# Patient Record
Sex: Female | Born: 1970 | Race: White | Hispanic: No | Marital: Single | Smoking: Current every day smoker
Health system: Southern US, Community
[De-identification: ages and names within clinical notes are randomized; demographics above are authoritative.]

## PROBLEM LIST (undated history)

## (undated) DIAGNOSIS — F419 Anxiety disorder, unspecified: Secondary | ICD-10-CM

## (undated) DIAGNOSIS — R51 Headache: Secondary | ICD-10-CM

## (undated) DIAGNOSIS — K219 Gastro-esophageal reflux disease without esophagitis: Secondary | ICD-10-CM

## (undated) DIAGNOSIS — F17200 Nicotine dependence, unspecified, uncomplicated: Secondary | ICD-10-CM

## (undated) DIAGNOSIS — E785 Hyperlipidemia, unspecified: Secondary | ICD-10-CM

## (undated) HISTORY — DX: Anxiety disorder, unspecified: F41.9

## (undated) HISTORY — PX: WISDOM TOOTH EXTRACTION: SHX21

## (undated) HISTORY — DX: Nicotine dependence, unspecified, uncomplicated: F17.200

---

## 2011-02-17 ENCOUNTER — Other Ambulatory Visit: Payer: Self-pay | Admitting: Neurosurgery

## 2011-02-24 ENCOUNTER — Encounter (HOSPITAL_COMMUNITY): Payer: Self-pay | Admitting: Pharmacy Technician

## 2011-03-04 ENCOUNTER — Encounter (HOSPITAL_COMMUNITY)
Admission: RE | Admit: 2011-03-04 | Discharge: 2011-03-04 | Disposition: A | Discharge status: Home / Self Care | Payer: BC Managed Care – PPO | Source: Ambulatory Visit | Attending: Neurosurgery | Admitting: Neurosurgery

## 2011-03-04 ENCOUNTER — Encounter (HOSPITAL_COMMUNITY): Payer: Self-pay

## 2011-03-04 HISTORY — DX: Gastro-esophageal reflux disease without esophagitis: K21.9

## 2011-03-04 HISTORY — DX: Headache: R51

## 2011-03-04 LAB — CBC
HCT: 42.9 % (ref 36.0–46.0)
MCH: 30.6 pg (ref 26.0–34.0)
MCV: 89.4 fL (ref 78.0–100.0)
Platelets: 240 10*3/uL (ref 150–400)
RBC: 4.8 MIL/uL (ref 3.87–5.11)
WBC: 8.2 10*3/uL (ref 4.0–10.5)

## 2011-03-04 LAB — SURGICAL PCR SCREEN: Staphylococcus aureus: NEGATIVE

## 2011-03-04 NOTE — Pre-Procedure Instructions (Addendum)
18 Lorraine Gray  03/04/2011   Your procedure is scheduled on:  2.13.13  Report to [facility] at 630AM.  Call this number if you have problems the morning of surgery: [phone]   Remember:   Do not eat food:After Midnight.  May have clear liquids: up to 4 Hours before arrival.  Clear liquids include soda, tea, black coffee, apple or grape juice, broth.  Take these medicines the morning of surgery with A SIP OF WATER:xanax  STOP goody powder, any asa , nsaids, herbal meds,blood thinners   Do not wear jewelry, make-up or nail polish.  Do not wear lotions, powders, or perfumes. You may wear deodorant.  Do not shave 48 hours prior to surgery.  Do not bring valuables to the hospital.  Contacts, dentures or bridgework may not be worn into surgery.  Leave suitcase in the car. After surgery it may be brought to your room.  For patients admitted to the hospital, checkout time is 11:00 AM the day of discharge.   Patients discharged the day of surgery will not be allowed to drive home.  Name and phone number of your driver:mom debbie elmore [phone]  Special Instructions: CHG Shower Use Special Wash: 1/2 bottle night before surgery and 1/2 bottle morning of surgery.   Please read over the following fact sheets that you were given: Pain Booklet, Coughing and Deep Breathing, MRSA Information and Surgical Site Infection Prevention

## 2011-03-10 MED ORDER — CEFAZOLIN SODIUM 1-5 GM-% IV SOLN
1.0000 g | INTRAVENOUS | Status: AC
  Administered 2011-03-11: 1 g via INTRAVENOUS
  Filled 2011-03-10: qty 50

## 2011-03-11 ENCOUNTER — Ambulatory Visit (HOSPITAL_COMMUNITY)
Admission: RE | Admit: 2011-03-11 | Discharge: 2011-03-12 | Disposition: A | Discharge status: Home / Self Care | Payer: BC Managed Care – PPO | Source: Ambulatory Visit | Attending: Neurosurgery | Admitting: Neurosurgery

## 2011-03-11 ENCOUNTER — Encounter (HOSPITAL_COMMUNITY)
Admission: RE | Disposition: A | Discharge status: Home / Self Care | Payer: Self-pay | Source: Ambulatory Visit | Attending: Neurosurgery

## 2011-03-11 ENCOUNTER — Ambulatory Visit (HOSPITAL_COMMUNITY): Payer: BC Managed Care – PPO

## 2011-03-11 ENCOUNTER — Encounter (HOSPITAL_COMMUNITY): Payer: Self-pay | Admitting: Anesthesiology

## 2011-03-11 ENCOUNTER — Ambulatory Visit (HOSPITAL_COMMUNITY): Payer: BC Managed Care – PPO | Admitting: Anesthesiology

## 2011-03-11 ENCOUNTER — Encounter (HOSPITAL_COMMUNITY): Payer: Self-pay | Admitting: *Deleted

## 2011-03-11 DIAGNOSIS — K219 Gastro-esophageal reflux disease without esophagitis: Secondary | ICD-10-CM | POA: Insufficient documentation

## 2011-03-11 DIAGNOSIS — M5000 Cervical disc disorder with myelopathy, unspecified cervical region: Secondary | ICD-10-CM | POA: Insufficient documentation

## 2011-03-11 DIAGNOSIS — Z981 Arthrodesis status: Secondary | ICD-10-CM

## 2011-03-11 DIAGNOSIS — M4712 Other spondylosis with myelopathy, cervical region: Secondary | ICD-10-CM | POA: Insufficient documentation

## 2011-03-11 DIAGNOSIS — Z01812 Encounter for preprocedural laboratory examination: Secondary | ICD-10-CM | POA: Insufficient documentation

## 2011-03-11 DIAGNOSIS — R51 Headache: Secondary | ICD-10-CM | POA: Insufficient documentation

## 2011-03-11 HISTORY — PX: ANTERIOR CERVICAL DECOMP/DISCECTOMY FUSION: SHX1161

## 2011-03-11 SURGERY — ANTERIOR CERVICAL DECOMPRESSION/DISCECTOMY FUSION 3 LEVELS
Anesthesia: General | Site: Neck | Wound class: Clean

## 2011-03-11 MED ORDER — ROCURONIUM BROMIDE 100 MG/10ML IV SOLN
INTRAVENOUS | Status: DC | PRN
  Administered 2011-03-11: 50 mg via INTRAVENOUS

## 2011-03-11 MED ORDER — THROMBIN 20000 UNITS EX KIT
PACK | CUTANEOUS | Status: DC | PRN
  Administered 2011-03-11: 09:00:00 via TOPICAL

## 2011-03-11 MED ORDER — ONDANSETRON HCL 4 MG/2ML IJ SOLN
INTRAMUSCULAR | Status: DC | PRN
  Administered 2011-03-11: 4 mg via INTRAVENOUS

## 2011-03-11 MED ORDER — BACITRACIN 50000 UNITS IM SOLR
INTRAMUSCULAR | Status: AC
  Filled 2011-03-11: qty 1

## 2011-03-11 MED ORDER — FENTANYL CITRATE 0.05 MG/ML IJ SOLN
INTRAMUSCULAR | Status: AC
  Filled 2011-03-11: qty 2

## 2011-03-11 MED ORDER — ZOLPIDEM TARTRATE 10 MG PO TABS: 10.0000 mg | ORAL_TABLET | Freq: Every evening | ORAL | Status: DC | PRN

## 2011-03-11 MED ORDER — VECURONIUM BROMIDE 10 MG IV SOLR
INTRAVENOUS | Status: DC | PRN
  Administered 2011-03-11: 1 mg via INTRAVENOUS
  Administered 2011-03-11: 2 mg via INTRAVENOUS

## 2011-03-11 MED ORDER — PROMETHAZINE HCL 25 MG/ML IJ SOLN
6.2500 mg | INTRAMUSCULAR | Status: DC | PRN
  Administered 2011-03-11: 6.25 mg via INTRAVENOUS

## 2011-03-11 MED ORDER — ACETAMINOPHEN 325 MG PO TABS: 650.0000 mg | ORAL_TABLET | ORAL | Status: DC | PRN

## 2011-03-11 MED ORDER — LACTATED RINGERS IV SOLN: INTRAVENOUS | Status: DC

## 2011-03-11 MED ORDER — DEXAMETHASONE SODIUM PHOSPHATE 4 MG/ML IJ SOLN
4.0000 mg | Freq: Four times a day (QID) | INTRAMUSCULAR | Status: AC
  Administered 2011-03-11 (×2): 4 mg via INTRAVENOUS
  Filled 2011-03-11 (×2): qty 1

## 2011-03-11 MED ORDER — BUPIVACAINE-EPINEPHRINE 0.5% -1:200000 IJ SOLN
INTRAMUSCULAR | Status: DC | PRN
  Administered 2011-03-11: 10 mL

## 2011-03-11 MED ORDER — DEXAMETHASONE 4 MG PO TABS: 4.0000 mg | ORAL_TABLET | Freq: Four times a day (QID) | ORAL | Status: AC

## 2011-03-11 MED ORDER — ONDANSETRON HCL 4 MG/2ML IJ SOLN
4.0000 mg | INTRAMUSCULAR | Status: DC | PRN
  Administered 2011-03-11: 4 mg via INTRAVENOUS
  Filled 2011-03-11: qty 2

## 2011-03-11 MED ORDER — SODIUM CHLORIDE 0.9 % IV SOLN
INTRAVENOUS | Status: AC
  Filled 2011-03-11: qty 500

## 2011-03-11 MED ORDER — NEOSTIGMINE METHYLSULFATE 1 MG/ML IJ SOLN
INTRAMUSCULAR | Status: DC | PRN
  Administered 2011-03-11: 4 mg via INTRAVENOUS

## 2011-03-11 MED ORDER — 0.9 % SODIUM CHLORIDE (POUR BTL) OPTIME
TOPICAL | Status: DC | PRN
  Administered 2011-03-11: 1000 mL

## 2011-03-11 MED ORDER — PHENOL 1.4 % MT LIQD
1.0000 | OROMUCOSAL | Status: DC | PRN
  Administered 2011-03-11: 1 via OROMUCOSAL
  Filled 2011-03-11: qty 177

## 2011-03-11 MED ORDER — PANTOPRAZOLE SODIUM 40 MG IV SOLR
40.0000 mg | Freq: Every day | INTRAVENOUS | Status: DC
  Administered 2011-03-11: 40 mg via INTRAVENOUS
  Filled 2011-03-11 (×2): qty 40

## 2011-03-11 MED ORDER — MEPERIDINE HCL 25 MG/ML IJ SOLN: 6.2500 mg | INTRAMUSCULAR | Status: DC | PRN

## 2011-03-11 MED ORDER — PROMETHAZINE HCL 25 MG/ML IJ SOLN
INTRAMUSCULAR | Status: AC
  Filled 2011-03-11: qty 1

## 2011-03-11 MED ORDER — SODIUM CHLORIDE 0.9 % IR SOLN
Status: DC | PRN
  Administered 2011-03-11: 09:00:00

## 2011-03-11 MED ORDER — MIDAZOLAM HCL 5 MG/5ML IJ SOLN
INTRAMUSCULAR | Status: DC | PRN
  Administered 2011-03-11: 2 mg via INTRAVENOUS

## 2011-03-11 MED ORDER — PROCHLORPERAZINE 25 MG RE SUPP
25.0000 mg | Freq: Two times a day (BID) | RECTAL | Status: DC | PRN
  Administered 2011-03-11: 25 mg via RECTAL
  Filled 2011-03-11: qty 1

## 2011-03-11 MED ORDER — ACETAMINOPHEN 650 MG RE SUPP: 650.0000 mg | RECTAL | Status: DC | PRN

## 2011-03-11 MED ORDER — LACTATED RINGERS IV SOLN
INTRAVENOUS | Status: DC | PRN
  Administered 2011-03-11 (×2): via INTRAVENOUS

## 2011-03-11 MED ORDER — HYDROMORPHONE HCL 4 MG PO TABS
4.0000 mg | ORAL_TABLET | ORAL | Status: DC | PRN
  Administered 2011-03-11 – 2011-03-12 (×3): 4 mg via ORAL
  Filled 2011-03-11: qty 2
  Filled 2011-03-11: qty 4
  Filled 2011-03-11: qty 2

## 2011-03-11 MED ORDER — PROPOFOL 10 MG/ML IV EMUL
INTRAVENOUS | Status: DC | PRN
  Administered 2011-03-11: 150 mg via INTRAVENOUS

## 2011-03-11 MED ORDER — MORPHINE SULFATE 4 MG/ML IJ SOLN
1.0000 mg | INTRAMUSCULAR | Status: DC | PRN
  Administered 2011-03-11: 4 mg via INTRAVENOUS
  Filled 2011-03-11: qty 1

## 2011-03-11 MED ORDER — DIAZEPAM 5 MG PO TABS
5.0000 mg | ORAL_TABLET | Freq: Four times a day (QID) | ORAL | Status: DC | PRN
  Administered 2011-03-11: 5 mg via ORAL
  Filled 2011-03-11: qty 1

## 2011-03-11 MED ORDER — GLYCOPYRROLATE 0.2 MG/ML IJ SOLN
INTRAMUSCULAR | Status: DC | PRN
  Administered 2011-03-11: .7 mg via INTRAVENOUS

## 2011-03-11 MED ORDER — BACITRACIN ZINC 500 UNIT/GM EX OINT
TOPICAL_OINTMENT | CUTANEOUS | Status: DC | PRN
  Administered 2011-03-11: 1 via TOPICAL

## 2011-03-11 MED ORDER — FENTANYL CITRATE 0.05 MG/ML IJ SOLN
25.0000 ug | INTRAMUSCULAR | Status: DC | PRN
  Administered 2011-03-11: 50 ug via INTRAVENOUS
  Administered 2011-03-11 (×2): 25 ug via INTRAVENOUS

## 2011-03-11 MED ORDER — CEFAZOLIN SODIUM 1-5 GM-% IV SOLN
1.0000 g | Freq: Three times a day (TID) | INTRAVENOUS | Status: AC
  Administered 2011-03-11 (×2): 1 g via INTRAVENOUS
  Filled 2011-03-11 (×2): qty 50

## 2011-03-11 MED ORDER — HETASTARCH-ELECTROLYTES 6 % IV SOLN
INTRAVENOUS | Status: DC | PRN
  Administered 2011-03-11: 09:00:00 via INTRAVENOUS

## 2011-03-11 MED ORDER — FENTANYL CITRATE 0.05 MG/ML IJ SOLN
INTRAMUSCULAR | Status: DC | PRN
  Administered 2011-03-11: 150 ug via INTRAVENOUS
  Administered 2011-03-11: 50 ug via INTRAVENOUS

## 2011-03-11 MED ORDER — MENTHOL 3 MG MT LOZG
1.0000 | LOZENGE | OROMUCOSAL | Status: DC | PRN
  Administered 2011-03-11: 3 mg via ORAL
  Filled 2011-03-11: qty 9

## 2011-03-11 SURGICAL SUPPLY — 65 items
APL SKNCLS STERI-STRIP NONHPOA (GAUZE/BANDAGES/DRESSINGS) ×2
BAG DECANTER FOR FLEXI CONT (MISCELLANEOUS) ×3 IMPLANT
BENZOIN TINCTURE PRP APPL 2/3 (GAUZE/BANDAGES/DRESSINGS) ×3 IMPLANT
BIT DRILL SPINE QC 12 (BIT) ×3 IMPLANT
BLADE SURG 15 STRL LF DISP TIS (BLADE) ×2 IMPLANT
BLADE SURG 15 STRL SS (BLADE) ×3
BLADE ULTRA TIP 2M (BLADE) ×3 IMPLANT
BRUSH SCRUB EZ PLAIN DRY (MISCELLANEOUS) ×3 IMPLANT
BUR BARREL STRAIGHT FLUTE 4.0 (BURR) ×3 IMPLANT
BUR MATCHSTICK NEURO 3.0 LAGG (BURR) ×3 IMPLANT
CANISTER SUCTION 2500CC (MISCELLANEOUS) ×3 IMPLANT
CLOSURE STERI STRIP 1/2 X4 (GAUZE/BANDAGES/DRESSINGS) ×3 IMPLANT
CLOTH BEACON ORANGE TIMEOUT ST (SAFETY) ×3 IMPLANT
CONT SPEC 4OZ CLIKSEAL STRL BL (MISCELLANEOUS) ×3 IMPLANT
COVER MAYO STAND STRL (DRAPES) ×3 IMPLANT
DEVICE FUSION VIST S 14X14X6MM (Trauma) ×4 IMPLANT
DRAPE LAPAROTOMY 100X72 PEDS (DRAPES) ×3 IMPLANT
DRAPE MICROSCOPE LEICA (MISCELLANEOUS) IMPLANT
DRAPE POUCH INSTRU U-SHP 10X18 (DRAPES) ×3 IMPLANT
DRAPE SURG 17X23 STRL (DRAPES) ×6 IMPLANT
ELECT REM PT RETURN 9FT ADLT (ELECTROSURGICAL) ×3
ELECTRODE REM PT RTRN 9FT ADLT (ELECTROSURGICAL) ×2 IMPLANT
GAUZE SPONGE 4X4 16PLY XRAY LF (GAUZE/BANDAGES/DRESSINGS) ×3 IMPLANT
GLOVE BIO SURGEON STRL SZ8.5 (GLOVE) ×3 IMPLANT
GLOVE BIOGEL PI IND STRL 8 (GLOVE) ×4 IMPLANT
GLOVE BIOGEL PI INDICATOR 8 (GLOVE) ×2
GLOVE ECLIPSE 7.5 STRL STRAW (GLOVE) ×12 IMPLANT
GLOVE EXAM NITRILE LRG STRL (GLOVE) IMPLANT
GLOVE EXAM NITRILE MD LF STRL (GLOVE) ×6 IMPLANT
GLOVE EXAM NITRILE XL STR (GLOVE) IMPLANT
GLOVE EXAM NITRILE XS STR PU (GLOVE) IMPLANT
GLOVE SS BIOGEL STRL SZ 8 (GLOVE) ×2 IMPLANT
GLOVE SUPERSENSE BIOGEL SZ 8 (GLOVE) ×1
GOWN BRE IMP SLV AUR LG STRL (GOWN DISPOSABLE) IMPLANT
GOWN BRE IMP SLV AUR XL STRL (GOWN DISPOSABLE) ×6 IMPLANT
GOWN PREVENTION PLUS XXLARGE (GOWN DISPOSABLE) ×3 IMPLANT
KIT BASIN OR (CUSTOM PROCEDURE TRAY) ×3 IMPLANT
KIT ROOM TURNOVER OR (KITS) ×3 IMPLANT
MARKER SKIN DUAL TIP RULER LAB (MISCELLANEOUS) ×3 IMPLANT
NEEDLE HYPO 22GX1.5 SAFETY (NEEDLE) ×3 IMPLANT
NEEDLE SPNL 18GX3.5 QUINCKE PK (NEEDLE) ×3 IMPLANT
NS IRRIG 1000ML POUR BTL (IV SOLUTION) ×3 IMPLANT
PACK LAMINECTOMY NEURO (CUSTOM PROCEDURE TRAY) ×3 IMPLANT
PATTIES SURGICAL .5 X.5 (GAUZE/BANDAGES/DRESSINGS) ×6 IMPLANT
PATTIES SURGICAL 1X1 (DISPOSABLE) ×6 IMPLANT
PIN DISTRACTION 14MM (PIN) ×6 IMPLANT
PLATE ANT CERV XTEND 3 LV 45 (Plate) ×3 IMPLANT
PUTTY 5ML ACTIFUSE ABX (Putty) ×3 IMPLANT
RUBBERBAND STERILE (MISCELLANEOUS) IMPLANT
SCREW XTD VAR 4.2 SELF TAP 12 (Screw) ×24 IMPLANT
SPONGE GAUZE 4X4 12PLY (GAUZE/BANDAGES/DRESSINGS) ×3 IMPLANT
SPONGE INTESTINAL PEANUT (DISPOSABLE) ×6 IMPLANT
SPONGE SURGIFOAM ABS GEL 100 (HEMOSTASIS) ×3 IMPLANT
STRIP CLOSURE SKIN 1/2X4 (GAUZE/BANDAGES/DRESSINGS) ×3 IMPLANT
SUT VIC AB 0 CT1 27 (SUTURE) ×2
SUT VIC AB 0 CT1 27XBRD ANTBC (SUTURE) ×2 IMPLANT
SUT VIC AB 3-0 SH 8-18 (SUTURE) ×6 IMPLANT
SYR 20ML ECCENTRIC (SYRINGE) ×3 IMPLANT
TAPE HYPAFIX 6X30 (GAUZE/BANDAGES/DRESSINGS) ×3 IMPLANT
TOWEL OR 17X24 6PK STRL BLUE (TOWEL DISPOSABLE) ×3 IMPLANT
TOWEL OR 17X26 10 PK STRL BLUE (TOWEL DISPOSABLE) ×3 IMPLANT
TRAY FOLEY CATH 14FRSI W/METER (CATHETERS) ×3 IMPLANT
VISTA S 14X14X7 (Spacer) ×3 IMPLANT
VISTA S O 14X14X6MM (Trauma) ×6 IMPLANT
WATER STERILE IRR 1000ML POUR (IV SOLUTION) ×3 IMPLANT

## 2011-03-11 NOTE — Anesthesia Procedure Notes (Signed)
Procedure Name: Intubation Date/Time: 03/11/2011 8:40 AM Performed by: Margaree Mackintosh Pre-anesthesia Checklist: Patient identified, Timeout performed, Emergency Drugs available, Suction available and Patient being monitored Patient Re-evaluated:Patient Re-evaluated prior to inductionOxygen Delivery Method: Circle System Utilized Preoxygenation: Pre-oxygenation with 100% oxygen Intubation Type: IV induction Ventilation: Mask ventilation without difficulty Laryngoscope Size: Mac and 3 Grade View: Grade I Tube type: Oral Tube size: 7.5 mm Number of attempts: 1 Airway Equipment and Method: stylet Placement Confirmation: ETT inserted through vocal cords under direct vision,  positive ETCO2 and breath sounds checked- equal and bilateral Secured at: 21 cm Tube secured with: Tape Dental Injury: Teeth and Oropharynx as per pre-operative assessment

## 2011-03-11 NOTE — H&P (Signed)
Subjective: Patient is a 41 year old white female complains of neck pain and bilateral hand numbness. He was worked up with a cervical MRI which demonstrates spondylosis stenosis etc. at C4-5, C5-6, C6-7. I discussed the various treatment options with her including surgery. Patient has weighed weighed the risks benefits and alternatives surgery decided proceed with this operation.  Past Medical History  Diagnosis Date  . GERD (gastroesophageal reflux disease)   . Headache     History reviewed. No pertinent past surgical history.  Allergies  Allergen Reactions  . Hydrocodone Nausea And Vomiting    History  Substance Use Topics  . Smoking status: Current Everyday Smoker -- 1.0 packs/day  . Smokeless tobacco: Not on file  . Alcohol Use: No    History reviewed. No pertinent family history. Prior to Admission medications   Medication Sig Start Date End Date Taking? Authorizing Provider  ALPRAZolam Prudy Feeler) 0.5 MG tablet Take 0.5 mg by mouth daily as needed. For anxiety attacks   Yes Historical Provider, MD  calcium carbonate (TUMS - DOSED IN MG ELEMENTAL CALCIUM) 500 MG chewable tablet Chew 2 tablets by mouth 3 (three) times daily as needed. For stomach distress   Yes Historical Provider, MD  OVER THE COUNTER MEDICATION Take 1 packet by mouth every 6 (six) hours as needed. For headaches.Marland KitchenMarland KitchenGoody's Powders   Yes Historical Provider, MD     Review of Systems  Positive ROS: As above  All other systems have been reviewed and were otherwise negative with the exception of those mentioned in the HPI and as above.  Objective: Vital signs in last 24 hours: Temp:  [98.2 F (36.8 C)] 98.2 F (36.8 C) (02/13 0709) Pulse Rate:  [79] 79  (02/13 0709) Resp:  [18] 18  (02/13 0709) BP: (100)/(66) 100/66 mmHg (02/13 0709) SpO2:  [98 %] 98 % (02/13 0709)  General Appearance: Alert, cooperative, no distress, appears stated age Head: Normocephalic, without obvious abnormality, atraumatic Eyes:  PERRL, conjunctiva/corneas clear, EOM's intact, fundi benign, both eyes      Ears: Normal TM's and external ear canals, both ears Throat: Lips, mucosa, and tongue normal; teeth and gums normal Neck: Supple, symmetrical, trachea midline, no adenopathy; thyroid: No enlargement/tenderness/nodules; no carotid bruit or JVD Back: Symmetric, no curvature, ROM normal, no CVA tenderness Lungs: Clear to auscultation bilaterally, respirations unlabored Heart: Regular rate and rhythm, S1 and S2 normal, no murmur, rub or gallop Abdomen: Soft, non-tender, bowel sounds active all four quadrants, no masses, no organomegaly Extremities: Extremities normal, atraumatic, no cyanosis or edema Pulses: 2+ and symmetric all extremities Skin: Skin color, texture, turgor normal, no rashes or lesions  NEUROLOGIC:   Mental status: alert and oriented, no aphasia, good attention span, Fund of knowledge/ memory ok Motor Exam - grossly normal Sensory Exam - grossly normal Reflexes:  Coordination - grossly normal Gait - grossly normal Balance - grossly normal Cranial Nerves: I: smell Not tested  II: visual acuity  OS: None    OD: None   II: visual fields Full to confrontation  II: pupils Equal, round, reactive to light  III,VII: ptosis None  III,IV,VI: extraocular muscles  Full ROM  V: mastication Normal  V: facial light touch sensation  Normal  V,VII: corneal reflex  Present  VII: facial muscle function - upper  Normal  VII: facial muscle function - lower Normal  VIII: hearing Not tested  IX: soft palate elevation  Normal  IX,X: gag reflex Present  XI: trapezius strength  5/5  XI: sternocleidomastoid strength 5/5  XI: neck flexion strength  5/5  XII: tongue strength  Normal    Data Review Lab Results  Component Value Date   WBC 8.2 03/04/2011   HGB 14.7 03/04/2011   HCT 42.9 03/04/2011   MCV 89.4 03/04/2011   PLT 240 03/04/2011   No results found for this basename: NA, K, CL, CO2, BUN, creatinine, glucose     No results found for this basename: INR, PROTIME    Assessment/Plan: C4-5, C5-6 and C6-7 herniated disc, spondylosis, stenosis, cervical radicular/myelopathy I discussed the situation with the patient. I reviewed her MR scan with her and pointed out the abnormalities. Described the surgery, C4-5, C5-6 and C7 intracervical dissection he fusion and plating. I've shown her surgical models. We have discussed the risks, benefits, alternatives and likelihood of achieving our goals with surgery. She has decided proceed with the operation.   Nancee Brownrigg D 03/11/2011 8:14 AM

## 2011-03-11 NOTE — Anesthesia Preprocedure Evaluation (Addendum)
Anesthesia Evaluation  Patient identified by MRN, date of birth, ID band Patient awake    Reviewed: Allergy & Precautions, H&P , NPO status , Patient's Chart, lab work & pertinent test results, reviewed documented beta blocker date and time   Airway Mallampati: I      Dental  (+) Teeth Intact and Dental Advisory Given   Pulmonary  clear to auscultation        Cardiovascular neg cardio ROS Regular Normal    Neuro/Psych  Headaches,    GI/Hepatic Neg liver ROS, GERD-  Controlled,  Endo/Other  Negative Endocrine ROS  Renal/GU negative Renal ROS     Musculoskeletal   Abdominal   Peds  Hematology negative hematology ROS (+)   Anesthesia Other Findings   Reproductive/Obstetrics                          Anesthesia Physical Anesthesia Plan  ASA: II  Anesthesia Plan: General   Post-op Pain Management:    Induction: Intravenous  Airway Management Planned: Oral ETT  Additional Equipment:   Intra-op Plan:   Post-operative Plan: Extubation in OR  Informed Consent: I have reviewed the patients History and Physical, chart, labs and discussed the procedure including the risks, benefits and alternatives for the proposed anesthesia with the patient or authorized representative who has indicated his/her understanding and acceptance.   Dental advisory given  Plan Discussed with: CRNA and Anesthesiologist  Anesthesia Plan Comments:        Anesthesia Quick Evaluation

## 2011-03-11 NOTE — Transfer of Care (Signed)
Immediate Anesthesia Transfer of Care Note  Patient: Lorraine Gray  Procedure(s) Performed: Procedure(s) (LRB): ANTERIOR CERVICAL DECOMPRESSION/DISCECTOMY FUSION 3 LEVELS (N/A)  Patient Location: PACU  Anesthesia Type: General  Level of Consciousness: awake  Airway & Oxygen Therapy: Patient Spontanous Breathing and Patient connected to nasal cannula oxygen  Post-op Assessment: Report given to PACU RN and Post -op Vital signs reviewed and stable  Post vital signs: Reviewed and stable  Complications: No apparent anesthesia complications

## 2011-03-11 NOTE — Plan of Care (Signed)
Problem: Consults Goal: Diagnosis - Spinal Surgery Outcome: Completed/Met Date Met:  03/11/11 Cervical Spine Fusion     

## 2011-03-11 NOTE — Op Note (Signed)
Brief history: The patient is a 41 year old white female who has suffered from neck and bilateral arm and hand pain numbness tingling weakness etc. consistent with a cervical myelopathy. She has failed medical management and was worked up with a cervical MRI which demonstrated C4-5, C5-6 and C6-7 herniated disc spondylosis stenosis etc. I discussed the various treatment options with the patient including surgery. She has weighed the risks, benefits, and alternatives surgery and wants to proceed with renal anterior cervical discectomy fusion and plating.  Preoperative diagnosis: C4-5, C5-6 and C6-7 herniated disc, spondylosis, stenosis, cervical myelopathy/radiculopathy, cervicalgia  Postoperative diagnosis: The same  Procedure: C4-5, C5-6 and C6-7 Anterior cervical discectomy/decompression; C4-5, C5-6 and C6-7 interbody arthrodesis with local morcellized autograft bone and Actifuse bone graft extender; insertion of interbody prosthesis at C4-5, C5-6 and C6-7 (Zimmer peek interbody prosthesis); anterior cervical plating from C4-C7 with globus titanium plate  Surgeon: Dr. Delma Officer  Asst.: Dr. Marikay Alar  Anesthesia: Gen. endotracheal  Estimated blood loss: 200 cc  Drains: None  Complications: None  Description of procedure: The patient was brought to the operating room by the anesthesia team. General endotracheal anesthesia was induced. A roll was placed under the patient's shoulders to keep the neck in the neutral position. The patient's anterior cervical region was then prepared with Betadine scrub and Betadine solution. Sterile drapes were applied.  The area to be incised was then injected with Marcaine with epinephrine solution. I then used a scalpel to make a transverse incision in the patient's left anterior neck. I used the Metzenbaum scissors to divide the platysmal muscle and then to dissect medial to the sternocleidomastoid muscle, jugular vein, and carotid artery. I carefully  dissected down towards the anterior cervical spine identifying the esophagus and retracting it medially. Then using Kitner swabs to clear soft tissue from the anterior cervical spine. We then inserted a bent spinal needle into the upper exposed intervertebral disc space. We then obtained intraoperative radiographs confirm our location.  I then used electrocautery to detach the medial border of the longus colli muscle bilaterally from the C4-5, C5-6 and C6-7 intervertebral disc spaces. I then inserted the Caspar self-retaining retractor underneath the longus colli muscle bilaterally to provide exposure.  We then incised the intervertebral disc at C4-5. We then performed a partial intervertebral discectomy with a pituitary forceps and the Karlin curettes. I then inserted distraction screws into the vertebral bodies at C4 and C5. We then distracted the interspace. We then used the high-speed drill to decorticate the vertebral endplates at C4-5, to drill away the remainder of the intervertebral disc, to drill away some posterior spondylosis, and to thin out the posterior longitudinal ligament. I then incised ligament with the arachnoid knife. We then removed the ligament with a Kerrison punches undercutting the vertebral endplates and decompressing the thecal sac. We then performed foraminotomies about the bilateral C5 nerve roots. This completed the decompression at this level.  We then repeated this procedure in an analogous fashion at C5-6 and C6-7 decompressing the thecal sac at these levels as well as a bilateral C6 and C7 nerve roots.  We now turned our to attention to the interbody fusion. We used the trial spacers to determine the appropriate size for the interbody prosthesis. We then pre-filled prosthesis with a combination of local morcellized autograft bone that we obtained during decompression as well as Actifuse bone graft extender. We then inserted the prosthesis into the distracted interspace at  C4-5, C5-6 and C6-7. We then removed the distraction  screws. There was a good snug fit of the prosthesis in the interspace.   Having completed the fusion we now turned attention to the anterior spinal instrumentation. We used the high-speed drill to drill away some anterior spondylosis at the disc spaces so that the plate lay down flat. We selected the appropriate length titanium anterior cervical plate. We laid it along the anterior aspect of the vertebral bodies from C4-C7. We then drilled 12 mm holes at C4, C5, C6 and C7. We then secured the plate to the vertebral bodies by placing two 12 mm self-tapping screws at C4, C5, C6 and C7. We then obtained intraoperative radiograph. The demonstrating good position of the instrumentation. We therefore secured the screws the plate the locking each cam. This completed the instrumentation.  We then obtained hemostasis using bipolar electrocautery. We irrigated the wound out with bacitracin solution. We then removed the retractor. We inspected the esophagus for any damage. There was none apparent. We then reapproximated patient's platysmal muscle with interrupted 3-0 Vicryl suture. We then reapproximated the subcutaneous tissue with interrupted 3-0 Vicryl suture. The skin was reapproximated with Steri-Strips and benzoin. The wound was then covered with bacitracin ointment. A sterile dressing was applied. The drapes were removed. Patient was subsequently extubated by the anesthesia team and transported to the post anesthesia care unit in stable condition. All sponge instrument and needle counts were correct at the end of this case.

## 2011-03-11 NOTE — Preoperative (Signed)
Beta Blockers   Reason not to administer Beta Blockers:Not Applicable. No home beta blockers 

## 2011-03-11 NOTE — Anesthesia Postprocedure Evaluation (Signed)
  Anesthesia Post-op Note  Patient: Lorraine Gray  Procedure(s) Performed: Procedure(s) (LRB): ANTERIOR CERVICAL DECOMPRESSION/DISCECTOMY FUSION 3 LEVELS (N/A)  Patient Location: PACU  Anesthesia Type: General  Level of Consciousness: awake  Airway and Oxygen Therapy: Patient Spontanous Breathing  Post-op Pain: mild  Post-op Assessment: Post-op Vital signs reviewed  Post-op Vital Signs: stable  Complications: No apparent anesthesia complications

## 2011-03-12 ENCOUNTER — Encounter (HOSPITAL_COMMUNITY): Payer: Self-pay | Admitting: Neurosurgery

## 2011-03-12 NOTE — Discharge Summary (Signed)
Physician Discharge Summary  Patient ID: Lorraine Gray MRN: [id number] DOB/AGE: [birth year] 41 y.o.  Admit date: 03/11/2011 Discharge date: 03/12/2011  Admission Diagnoses: Cervical spondylosis    Discharge Diagnoses: Same   Discharged Condition: good  Hospital Course: The patient was admitted on 03/11/2011 and taken to the operating room where the patient underwent ACDF. The patient tolerated the procedure well and was taken to the recovery room and then to the floor in stable condition. The hospital course was routine. There were no complications. The wound remained clean dry and intact. The patient remained afebrile with stable vital signs, and tolerated a regular diet. The patient continued to increase activities, and pain was well controlled with oral pain medications.   Consults: None  Significant Diagnostic Studies:  Results for orders placed during the hospital encounter of 03/04/11  CBC      Component Value Range   WBC 8.2  4.0 - 10.5 (K/uL)   RBC 4.80  3.87 - 5.11 (MIL/uL)   Hemoglobin 14.7  12.0 - 15.0 (g/dL)   HCT 40.9  81.1 - 91.4 (%)   MCV 89.4  78.0 - 100.0 (fL)   MCH 30.6  26.0 - 34.0 (pg)   MCHC 34.3  30.0 - 36.0 (g/dL)   RDW 78.2  95.6 - 21.3 (%)   Platelets 240  150 - 400 (K/uL)  HCG, SERUM, QUALITATIVE      Component Value Range   Preg, Serum NEGATIVE  NEGATIVE   SURGICAL PCR SCREEN      Component Value Range   MRSA, PCR NEGATIVE  NEGATIVE    Staphylococcus aureus NEGATIVE  NEGATIVE     Dg Cervical Spine 2-3 Views  03/11/2011  *RADIOLOGY REPORT*  Clinical Data: Neck pain  CERVICAL SPINE - 2-3 VIEW  Comparison: None.  Findings: The first film at 9:25 hours demonstrates a needle from an anterior approach directed into the C3-C4 disc space  Film #2 at 1150 hours demonstrates C4-C7 ACDF with anterior plating. Anatomic alignment.  No adverse features.  IMPRESSION: As above.  Original Report Authenticated By: Elsie Stain, M.D.    Antibiotics:    Anti-infectives     Start     Dose/Rate Route Frequency Ordered Stop   03/11/11 1500   ceFAZolin (ANCEF) IVPB 1 g/50 mL premix        1 g 100 mL/hr over 30 Minutes Intravenous Every 8 hours 03/11/11 1338 03/12/11 0002   03/11/11 0916   bacitracin 50,000 Units in sodium chloride irrigation 0.9 % 500 mL irrigation  Status:  Discontinued          As needed 03/11/11 0917 03/11/11 1210   03/11/11 0830   bacitracin 08657 UNITS injection     Comments: Worthy Flank: cabinet override         03/11/11 0830 03/11/11 2044   03/11/11 0500   ceFAZolin (ANCEF) IVPB 1 g/50 mL premix        1 g 100 mL/hr over 30 Minutes Intravenous 60 min pre-op 03/10/11 1540 03/11/11 0842          Discharge Exam: Blood pressure 110/72, pulse 77, temperature 98.7 F (37.1 C), temperature source Oral, resp. rate 20, last menstrual period 02/20/2011, SpO2 98.00%. Neurologic: Grossly normal  Discharge Medications:   Medication List  As of 03/12/2011 12:20 PM   TAKE these medications         ALPRAZolam 0.5 MG tablet   Commonly known as: XANAX   Take 0.5 mg by mouth daily  as needed. For anxiety attacks      calcium carbonate 500 MG chewable tablet   Commonly known as: TUMS - dosed in mg elemental calcium   Chew 2 tablets by mouth 3 (three) times daily as needed. For stomach distress      OVER THE COUNTER MEDICATION   Take 1 packet by mouth every 6 (six) hours as needed. For headaches.Marland KitchenMarland KitchenGoody's Powders            Disposition: home   Final Dx: ACDF  Discharge Orders    Future Orders Please Complete By Expires   Diet - low sodium heart healthy      Increase activity slowly      Call MD for:  temperature >100.4      Call MD for:  persistant nausea and vomiting      Call MD for:  severe uncontrolled pain      Call MD for:  redness, tenderness, or signs of infection (pain, swelling, redness, odor or green/yellow discharge around incision site)      Call MD for:  difficulty breathing, headache or  visual disturbances         Follow-up Information    Follow up with Cristi Loron, MD in 2 weeks.   Contact information:   [address], [address] [place] [address] [phone]           Signed: Tia Alert 03/12/2011, 12:20 PM

## 2011-03-12 NOTE — Discharge Instructions (Signed)
Wound Care Keep incision covered and dry for 3 days.   You may remove outer bandage after 3 days and shower.  Do not put any creams, lotions, or ointments on incision. Leave steri-strips on neck.  They will fall off by themselves. Activity Walk each and every day, increasing distance each day. No lifting greater than 5 lbs.  Avoid excessive neck motion. No driving for 2 weeks; may ride as a passenger locally. Wear neck brace at all times except when showering or otherwise instructed. Diet Resume your normal diet.  Return to Work Will be discussed at you follow up appointment. Call Your Doctor If Any of These Occur Redness, drainage, or swelling at the wound.  Temperature greater than 101 degrees. Severe pain not relieved by pain medication. Increased difficulty swallowing.  Incision starts to come apart. Follow Up Appt Call today for appointment in 3-4 weeks ([phone]) or for problems.  If you have any hardware placed in your spine, you will need an x-ray before your appointment.

## 2017-01-26 HISTORY — PX: OTHER SURGICAL HISTORY: SHX169

## 2017-02-26 ENCOUNTER — Ambulatory Visit: Payer: Self-pay | Admitting: Certified Nurse Midwife

## 2017-03-26 ENCOUNTER — Encounter: Payer: Self-pay | Admitting: Certified Nurse Midwife

## 2017-03-26 ENCOUNTER — Ambulatory Visit (INDEPENDENT_AMBULATORY_CARE_PROVIDER_SITE_OTHER): Payer: BLUE CROSS/BLUE SHIELD | Admitting: Certified Nurse Midwife

## 2017-03-26 VITALS — BP 102/72 | HR 69 | Ht 65.0 in | Wt 163.0 lb

## 2017-03-26 DIAGNOSIS — Z720 Tobacco use: Secondary | ICD-10-CM | POA: Diagnosis not present

## 2017-03-26 DIAGNOSIS — N951 Menopausal and female climacteric states: Secondary | ICD-10-CM

## 2017-03-26 DIAGNOSIS — Z01419 Encounter for gynecological examination (general) (routine) without abnormal findings: Secondary | ICD-10-CM | POA: Diagnosis not present

## 2017-03-26 DIAGNOSIS — Z1211 Encounter for screening for malignant neoplasm of colon: Secondary | ICD-10-CM

## 2017-03-26 NOTE — Patient Instructions (Signed)
Please call [facility] to schedule mammogram at [phone].  For hot flashes can try Vitamin E 400 IU twice a day or Remifemin (black cohash)   Health Risks of Smoking Smoking cigarettes is very bad for your health. Tobacco smoke has over 200 known poisons in it. It contains the poisonous gases nitrogen oxide and carbon monoxide. There are over 60 chemicals in tobacco smoke that cause cancer. Smoking is difficult to quit because a chemical in tobacco, called nicotine, causes addiction or dependence. When you smoke and inhale, nicotine is absorbed rapidly into the bloodstream through your lungs. Both inhaled and non-inhaled nicotine may be addictive. What are the risks of cigarette smoke? Cigarette smokers have an increased risk of many serious medical problems, including:  Lung cancer.  Lung disease, such as pneumonia, bronchitis, and emphysema.  Chest pain (angina) and heart attack because the heart is not getting enough oxygen.  Heart disease and peripheral blood vessel disease.  High blood pressure (hypertension).  Stroke.  Oral cancer, including cancer of the lip, mouth, or voice box.  Bladder cancer.  Pancreatic cancer.  Cervical cancer.  Pregnancy complications, including premature birth.  Stillbirths and smaller newborn babies, birth defects, and genetic damage to sperm.  Early menopause.  Lower estrogen level for women.  Infertility.  Facial wrinkles.  Blindness.  Increased risk of broken bones (fractures).  Senile dementia.  Stomach ulcers and internal bleeding.  Delayed wound healing and increased risk of complications during surgery.  Even smoking lightly shortens your life expectancy by several years.  Because of secondhand smoke exposure, children of smokers have an increased risk of the following:  Sudden infant death syndrome (SIDS).  Respiratory infections.  Lung cancer.  Heart disease.  Ear infections.  What are the  benefits of quitting? There are many health benefits of quitting smoking. Here are some of them:  Within days of quitting smoking, your risk of having a heart attack decreases, your blood flow improves, and your lung capacity improves. Blood pressure, pulse rate, and breathing patterns start returning to normal soon after quitting.  Within months, your lungs may clear up completely.  Quitting for 10 years reduces your risk of developing lung cancer and heart disease to almost that of a nonsmoker.  People who quit may see an improvement in their overall quality of life.  How do I quit smoking? Smoking is an addiction with both physical and psychological effects, and longtime habits can be hard to change. Your health care provider can recommend:  Programs and community resources, which may include group support, education, or talk therapy.  Prescription medicines to help reduce cravings.  Nicotine replacement products, such as patches, gum, and nasal sprays. Use these products only as directed. Do not replace cigarette smoking with electronic cigarettes, which are commonly called e-cigarettes. The safety of e-cigarettes is not known, and some may contain harmful chemicals.  A combination of two or more of these methods.  Where to find more information:  American Lung Association: www.lung.org  American Cancer Society: www.cancer.org Summary  Smoking cigarettes is very bad for your health. Cigarette smokers have an increased risk of many serious medical problems, including several cancers, heart disease, and stroke.  Smoking is an addiction with both physical and psychological effects, and longtime habits can be hard to change.  By stopping right away, you can greatly reduce the risk of medical problems for you and your family.  To help you quit smoking, your health care provider can recommend programs, community  resources, prescription medicines, and nicotine replacement products  such as patches, gum, and nasal sprays. This information is not intended to replace advice given to you by your health care provider. Make sure you discuss any questions you have with your health care provider. Document Released: 02/20/2004 Document Revised: 01/17/2016 Document Reviewed: 01/17/2016 Elsevier Interactive Patient Education  2017 [employer].  Steps to Quit Smoking Smoking tobacco can be harmful to your health and can affect almost every organ in your body. Smoking puts you, and those around you, at risk for developing many serious chronic diseases. Quitting smoking is difficult, but it is one of the best things that you can do for your health. It is never too late to quit. What are the benefits of quitting smoking? When you quit smoking, you lower your risk of developing serious diseases and conditions, such as:  Lung cancer or lung disease, such as COPD.  Heart disease.  Stroke.  Heart attack.  Infertility.  Osteoporosis and bone fractures.  Additionally, symptoms such as coughing, wheezing, and shortness of breath may get better when you quit. You may also find that you get sick less often because your body is stronger at fighting off colds and infections. If you are pregnant, quitting smoking can help to reduce your chances of having a baby of low birth weight. How do I get ready to quit? When you decide to quit smoking, create a plan to make sure that you are successful. Before you quit:  Pick a date to quit. Set a date within the next two weeks to give you time to prepare.  Write down the reasons why you are quitting. Keep this list in places where you will see it often, such as on your bathroom mirror or in your car or wallet.  Identify the people, places, things, and activities that make you want to smoke (triggers) and avoid them. Make sure to take these actions: ? Throw away all cigarettes at home, at work, and in your car. ? Throw away smoking accessories,  such as Scientist, research (medical). ? Clean your car and make sure to empty the ashtray. ? Clean your home, including curtains and carpets.  Tell your family, friends, and coworkers that you are quitting. Support from your loved ones can make quitting easier.  Talk with your health care provider about your options for quitting smoking.  Find out what treatment options are covered by your health insurance.  What strategies can I use to quit smoking? Talk with your healthcare provider about different strategies to quit smoking. Some strategies include:  Quitting smoking altogether instead of gradually lessening how much you smoke over a period of time. Research shows that quitting "cold [place]" is more successful than gradually quitting.  Attending in-person counseling to help you build problem-solving skills. You are more likely to have success in quitting if you attend several counseling sessions. Even short sessions of 10 minutes can be effective.  Finding resources and support systems that can help you to quit smoking and remain smoke-free after you quit. These resources are most helpful when you use them often. They can include: ? Online chats with a Social worker. ? Telephone quitlines. ? Careers information officer. ? Support groups or group counseling. ? Text messaging programs. ? Mobile phone applications.  Taking medicines to help you quit smoking. (If you are pregnant or breastfeeding, talk with your health care provider first.) Some medicines contain nicotine and some do not. Both types of medicines help with cravings,  but the medicines that include nicotine help to relieve withdrawal symptoms. Your health care provider may recommend: ? Nicotine patches, gum, or lozenges. ? Nicotine inhalers or sprays. ? Non-nicotine medicine that is taken by mouth.  Talk with your health care provider about combining strategies, such as taking medicines while you are also receiving in-person  counseling. Using these two strategies together makes you more likely to succeed in quitting than if you used either strategy on its own. If you are pregnant or breastfeeding, talk with your health care provider about finding counseling or other support strategies to quit smoking. Do not take medicine to help you quit smoking unless told to do so by your health care provider. What things can I do to make it easier to quit? Quitting smoking might feel overwhelming at first, but there is a lot that you can do to make it easier. Take these important actions:  Reach out to your family and friends and ask that they support and encourage you during this time. Call telephone quitlines, reach out to support groups, or work with a counselor for support.  Ask people who smoke to avoid smoking around you.  Avoid places that trigger you to smoke, such as bars, parties, or smoke-break areas at work.  Spend time around people who do not smoke.  Lessen stress in your life, because stress can be a smoking trigger for some people. To lessen stress, try: ? Exercising regularly. ? Deep-breathing exercises. ? Yoga. ? Meditating. ? Performing a body scan. This involves closing your eyes, scanning your body from head to toe, and noticing which parts of your body are particularly tense. Purposefully relax the muscles in those areas.  Download or purchase mobile phone or tablet apps (applications) that can help you stick to your quit plan by providing reminders, tips, and encouragement. There are many free apps, such as QuitGuide from the [employer] Office manager for Disease Control and Prevention). You can find other support for quitting smoking (smoking cessation) through smokefree.gov and other websites.  How will I feel when I quit smoking? Within the first 24 hours of quitting smoking, you may start to feel some withdrawal symptoms. These symptoms are usually most noticeable 2-3 days after quitting, but they usually do not  last beyond 2-3 weeks. Changes or symptoms that you might experience include:  Mood swings.  Restlessness, anxiety, or irritation.  Difficulty concentrating.  Dizziness.  Strong cravings for sugary foods in addition to nicotine.  Mild weight gain.  Constipation.  Nausea.  Coughing or a sore throat.  Changes in how your medicines work in your body.  A depressed mood.  Difficulty sleeping (insomnia).  After the first 2-3 weeks of quitting, you may start to notice more positive results, such as:  Improved sense of smell and taste.  Decreased coughing and sore throat.  Slower heart rate.  Lower blood pressure.  Clearer skin.  The ability to breathe more easily.  Fewer sick days.  Quitting smoking is very challenging for most people. Do not get discouraged if you are not successful the first time. Some people need to make many attempts to quit before they achieve long-term success. Do your best to stick to your quit plan, and talk with your health care provider if you have any questions or concerns. This information is not intended to replace advice given to you by your health care provider. Make sure you discuss any questions you have with your health care provider. Document Released: 01/06/2001 Document Revised:  09/10/2015 Document Reviewed: 05/29/2014 Elsevier Interactive Patient Education  [employer].

## 2017-03-26 NOTE — Progress Notes (Signed)
Gynecology Annual Exam  PCP: Patient, No Pcp Per  Chief Complaint:  Chief Complaint  Patient presents with  . Gynecologic Exam    History of Present Illness:Lorraine Gray is a 47 year old female, G1 P1001, who presents for her annual gyn exam. She is having problems with hot flashes and night sweats  Her menses have been irregular over the past 2 years, skipping months and occur every 1-2 months, lasting 4 days, with medium flow.  LMp 02/23/2017  She has had no intermenstrual spotting.   She reports dysmenorrhea. She occasionally uses Midol  with symptomatic relief.  The patient's past medical history is remarkable for anxiety, fibrocystic breasts, and a history of having a fusion of 3 cervical vertebrae. Since her last annual GYN exam 02/07/2016 , she has had a radicular cyst removed from the maxilla She is sexually active. She is currently using condoms for contraception.  Her most recent pap smear was obtained 02/07/2016 and was NIL/ negative HRHPV.  Her last mammogram was 03/06/2016 revealed multiple masses in both breasts. Additional views and an ultrasound was remarkable for multiple cysts in both breasts.  There is no family history of breast cancer.  There is no family history of ovarian cancer.  The patient does not do monthly self breast exams.  The patient smokes 1 ppd and has been smoking since the age of 7. Quit cold [place] once when she was pregnant. Stopped for 1 year.. Is not ready to quit. The patient does not drink alcohol.  The patient does not use illegal drugs.  The patient does not exercise.  The patient does not get adequate calcium in her diet.  She has had a recent cholesterol screen (2019) and it was borderline.  Past Medical History Review of Systems: Review of Systems  Constitutional: Negative for chills, fever and weight loss.  HENT: Negative for congestion, sinus pain and sore throat.   Eyes: Negative for blurred vision and pain.  Respiratory:  Negative for hemoptysis, shortness of breath and wheezing.   Cardiovascular: Negative for chest pain, palpitations and leg swelling.  Gastrointestinal: Negative for abdominal pain, blood in stool, diarrhea, heartburn, nausea and vomiting.  Genitourinary: Negative for dysuria, frequency, hematuria and urgency.  Musculoskeletal: Negative for back pain, joint pain and myalgias.  Skin: Negative for itching and rash.  Neurological: Negative for dizziness, tingling and headaches.  Endo/Heme/Allergies: Negative for environmental allergies and polydipsia. Does not bruise/bleed easily.       Negative for hirsutism; positive for hot flashes and night sweats.   Psychiatric/Behavioral: Negative for depression. The patient is nervous/anxious. The patient does not have insomnia.   Breasts: intermittent tenderness.  Past Medical History:  Past Medical History:  Diagnosis Date  . Anxiety   . GERD (gastroesophageal reflux disease)   . Headache(784.0)   . Tobacco dependence     Past Surgical History:  Past Surgical History:  Procedure Laterality Date  . ANTERIOR CERVICAL DECOMP/DISCECTOMY FUSION  03/11/2011   Procedure: ANTERIOR CERVICAL DECOMPRESSION/DISCECTOMY FUSION 3 LEVELS;  Surgeon: Ophelia Charter, MD;  Location: [facility];  Service: Neurosurgery;  Laterality: N/A;  Cervical four-five, five-six, six-seven anterior cervical decompression fusion with interbody prosthesis and plating  . Excision of a radicular cyst  01/2017   maxilla  . WISDOM TOOTH EXTRACTION      Family History:  Family History  Problem Relation Age of Onset  . Colon cancer Father 75  . Congestive Heart Failure Maternal Grandmother   .  Heart attack Maternal Grandmother   . Diabetes Maternal Grandfather   . Stroke Maternal Grandfather     Social History:  Social History   Socioeconomic History  . Marital status: Single    Spouse name: Not on file  . Number of children: 1  . Years of education: Not on file  .  Highest education level: Not on file  Social Needs  . Financial resource strain: Not on file  . Food insecurity - worry: Not on file  . Food insecurity - inability: Not on file  . Transportation needs - medical: Not on file  . Transportation needs - non-medical: Not on file  Occupational History  . Not on file  Tobacco Use  . Smoking status: Current Every Day Smoker    Packs/day: 1.00  . Smokeless tobacco: Never Used  Substance and Sexual Activity  . Alcohol use: No  . Drug use: No  . Sexual activity: Yes    Birth control/protection: None  Other Topics Concern  . Not on file  Social History Narrative  . Not on file    Allergies:  Allergies  Allergen Reactions  . Hydrocodone Nausea And Vomiting    Medications: Prior to Admission medications   Medication Sig Start Date End Date Taking? Authorizing Provider  ALPRAZolam Duanne Moron) 0.5 MG tablet Take 0.5 mg by mouth daily as needed. For anxiety attacks    [provider]  calcium carbonate (TUMS - DOSED IN MG ELEMENTAL CALCIUM) 500 MG chewable tablet Chew 2 tablets by mouth 3 (three) times daily as needed. For stomach distress    [provider]  OVER THE COUNTER MEDICATION Take 1 packet by mouth every 6 (six) hours as needed. For headaches.Marland KitchenMarland KitchenGoody's Powders    [provider]    Physical Exam Vitals: BP 102/72   Pulse 69   Ht 5\' 5"  (1.651 m)   Wt 163 lb (73.9 kg)   LMP 02/23/2017 (Exact Date)   BMI 27.12 kg/m .  General:WF in  NAD HEENT: normocephalic, anicteric Neck: no thyroid enlargement, no palpable nodules, no cervical lymphadenopathy  Pulmonary: No increased work of breathing, CTAB Cardiovascular: RRR, without murmur  Breast: Breast symmetrical, no tenderness, no palpable nodules or masses, no skin or nipple retraction present, no nipple discharge.  No axillary, infraclavicular or supraclavicular lymphadenopathy. Abdomen: Soft, non-tender, non-distended.  Umbilicus without lesions.  No  hepatomegaly or masses palpable. No evidence of hernia. Genitourinary:  External: Normal external female genitalia.  Normal urethral meatus, normal Bartholin's and Skene's glands.    Vagina: Normal vaginal mucosa, no evidence of prolapse.    Cervix: Grossly normal in appearance, no bleeding, non-tender  Uterus: Anteverted, normal size, shape, and consistency, mobile, and non-tender  Adnexa: No adnexal masses, non-tender  Rectal: no masses, hemoccult negative  Lymphatic: no evidence of inguinal lymphadenopathy Extremities: no edema, erythema, or tenderness Neurologic: Grossly intact Psychiatric: mood appropriate, affect full     Assessment: 47 y.o. annual gyn exam  Plan:   1) Breast cancer screening - recommend monthly self breast exam and annual mammograms. Mammogram was ordered today.  2) Osteoporosis prevention-discussed calcium and vitamin D3 requirements. Encouraged to take supplement. Explained role of exercise in preventing osteoporosis.  3) Cervical cancer screening - Pap not done. ASCCP guidelines and rational discussed.  Patient opts for every 3 years screening interval  4) Colon cancer screening-desires annual hemoccult. Today's was negative  5) Routine healthcare maintenance including cholesterol and diabetes screening UTD   6) Counseled on smoking cessation.  Not ready to quit.   7) Discussed treatments for hot flashes and night sweats including OTC, hormonal, and non hormonal medications. Patient wishes to try OTC medication: vit E.   Dalia Heading, CNM

## 2017-03-28 ENCOUNTER — Encounter: Payer: Self-pay | Admitting: Certified Nurse Midwife

## 2017-03-28 DIAGNOSIS — Z720 Tobacco use: Secondary | ICD-10-CM | POA: Insufficient documentation

## 2017-03-28 DIAGNOSIS — F419 Anxiety disorder, unspecified: Secondary | ICD-10-CM | POA: Insufficient documentation

## 2017-03-28 LAB — HEMOCCULT GUIAC POC 1CARD (OFFICE): FECAL OCCULT BLD: NEGATIVE

## 2018-04-06 ENCOUNTER — Telehealth: Payer: Self-pay

## 2018-04-06 NOTE — Telephone Encounter (Signed)
Pt calling to see if she needs to come in for a pap smear this year.  She had PE c her PCP.  Believes she needs mammogram.  [phone]  Left detailed msg per note from last PE her which was 03/26/16, it was decided pap would be done q3hrs which will be in 2021.  Mammo can be ordered by either [facility] or PCP.

## 2018-08-10 ENCOUNTER — Encounter: Payer: Self-pay | Admitting: Certified Nurse Midwife

## 2018-09-01 ENCOUNTER — Encounter: Payer: Self-pay | Admitting: Certified Nurse Midwife

## 2019-09-17 NOTE — Progress Notes (Signed)
PCP: Patient, No Pcp Per   Chief Complaint  Patient presents with  . Gynecologic Exam    HPI:      Lorraine Gray is a 49 y.o. G1P1001 whose LMP was Patient's last menstrual period was 03/27/2018 (approximate)., presents today for her annual examination.  Her menses are absent due to menopause. She does not have PMB. She does have vasomotor sx, tolerable with Vit E supp.   Sex activity: single partner, contraception - post menopausal status. She does not have vaginal dryness.  Last Pap: 02/07/16  Results were: no abnormalities /neg HPV DNA.   Last mammogram: 09/01/18  Results were: normal--routine follow-up in 12 months. Has appt today There is no FH of breast cancer. There is no FH of ovarian cancer. The patient does not do self-breast exams.  Colonoscopy: has appt with GI this week.  Tobacco use: 1 ppd, not planning to quit Alcohol use: none  No drug use Exercise: not active  She does get adequate calcium but not Vitamin D in her diet.  Labs with PCP.   Past Medical History:  Diagnosis Date  . Anxiety   . GERD (gastroesophageal reflux disease)   . Headache(784.0)   . Tobacco dependence     Past Surgical History:  Procedure Laterality Date  . ANTERIOR CERVICAL DECOMP/DISCECTOMY FUSION  03/11/2011   Procedure: ANTERIOR CERVICAL DECOMPRESSION/DISCECTOMY FUSION 3 LEVELS;  Surgeon: Ophelia Charter, MD;  Location: [facility];  Service: Neurosurgery;  Laterality: N/A;  Cervical four-five, five-six, six-seven anterior cervical decompression fusion with interbody prosthesis and plating  . Excision of a radicular cyst  01/2017   maxilla  . WISDOM TOOTH EXTRACTION      Family History  Problem Relation Age of Onset  . Colon cancer Father 57  . Congestive Heart Failure Maternal Grandmother   . Heart attack Maternal Grandmother   . Colon cancer Maternal Grandmother   . Diabetes Maternal Grandfather   . Stroke Maternal Grandfather     Social History    Socioeconomic History  . Marital status: Single    Spouse name: Not on file  . Number of children: 1  . Years of education: Not on file  . Highest education level: Not on file  Occupational History  . Not on file  Tobacco Use  . Smoking status: Current Every Day Smoker    Packs/day: 1.00  . Smokeless tobacco: Never Used  Vaping Use  . Vaping Use: Never used  Substance and Sexual Activity  . Alcohol use: No  . Drug use: No  . Sexual activity: Yes    Birth control/protection: None  Other Topics Concern  . Not on file  Social History Narrative  . Not on file   Social Determinants of Health   Financial Resource Strain:   . Difficulty of Paying Living Expenses: Not on file  Food Insecurity:   . Worried About Charity fundraiser in the Last Year: Not on file  . Ran Out of Food in the Last Year: Not on file  Transportation Needs:   . Lack of Transportation (Medical): Not on file  . Lack of Transportation (Non-Medical): Not on file  Physical Activity:   . Days of Exercise per Week: Not on file  . Minutes of Exercise per Session: Not on file  Stress:   . Feeling of Stress : Not on file  Social Connections:   . Frequency of Communication with Friends and Family: Not on file  . Frequency  of Social Gatherings with Friends and Family: Not on file  . Attends Religious Services: Not on file  . Active Member of Clubs or Organizations: Not on file  . Attends Archivist Meetings: Not on file  . Marital Status: Not on file  Intimate Partner Violence:   . Fear of Current or Ex-Partner: Not on file  . Emotionally Abused: Not on file  . Physically Abused: Not on file  . Sexually Abused: Not on file     Current Outpatient Medications:  .  lovastatin (MEVACOR) 20 MG tablet, TAKE 1 TABLET BY MOUTH EVERY DAY WITH DINNER, Disp: , Rfl:  .  vitamin E 180 MG (400 UNITS) capsule, Take by mouth., Disp: , Rfl:      ROS:  Review of Systems  Constitutional: Negative for  fatigue, fever and unexpected weight change.  Respiratory: Negative for cough, shortness of breath and wheezing.   Cardiovascular: Negative for chest pain, palpitations and leg swelling.  Gastrointestinal: Negative for blood in stool, constipation, diarrhea, nausea and vomiting.  Endocrine: Negative for cold intolerance, heat intolerance and polyuria.  Genitourinary: Negative for dyspareunia, dysuria, flank pain, frequency, genital sores, hematuria, menstrual problem, pelvic pain, urgency, vaginal bleeding, vaginal discharge and vaginal pain.  Musculoskeletal: Negative for back pain, joint swelling and myalgias.  Skin: Negative for rash.  Neurological: Negative for dizziness, syncope, light-headedness, numbness and headaches.  Hematological: Negative for adenopathy.  Psychiatric/Behavioral: Negative for agitation, confusion, sleep disturbance and suicidal ideas. The patient is not nervous/anxious.   BREAST: No symptoms    Objective: BP 130/90   Ht 5\' 5"  (1.651 m)   Wt 167 lb (75.8 kg)   LMP 03/27/2018 (Approximate)   BMI 27.79 kg/m    Physical Exam Constitutional:      Appearance: She is well-developed.  Genitourinary:     Vulva, vagina, cervix, uterus, right adnexa and left adnexa normal.     No vulval lesion or tenderness noted.     No vaginal discharge, erythema or tenderness.     No cervical polyp.     Uterus is not enlarged or tender.     No right or left adnexal mass present.     Right adnexa not tender.     Left adnexa not tender.  Neck:     Thyroid: No thyromegaly.  Cardiovascular:     Rate and Rhythm: Normal rate and regular rhythm.     Heart sounds: Normal heart sounds. No murmur heard.   Pulmonary:     Effort: Pulmonary effort is normal.     Breath sounds: Normal breath sounds.  Chest:     Breasts:        Right: No mass, nipple discharge, skin change or tenderness.        Left: No mass, nipple discharge, skin change or tenderness.  Abdominal:      Palpations: Abdomen is soft.     Tenderness: There is no abdominal tenderness. There is no guarding.  Musculoskeletal:        General: Normal range of motion.     Cervical back: Normal range of motion.  Neurological:     General: No focal deficit present.     Mental Status: She is alert and oriented to person, place, and time.     Cranial Nerves: No cranial nerve deficit.  Skin:    General: Skin is warm and dry.  Psychiatric:        Mood and Affect: Mood normal.  Behavior: Behavior normal.        Thought Content: Thought content normal.        Judgment: Judgment normal.  Vitals reviewed.     Assessment/Plan:  Encounter for annual routine gynecological examination  Encounter for screening mammogram for malignant neoplasm of breast - Plan: MM 3D SCREEN BREAST BILATERAL; pt has appt today  Screening for colon cancer--pt seeing GI this wk for colonoscopy consult  Vasomotor symptoms due to menopause--taking Vit E, an add estroven. F/u prn.           GYN counsel breast self exam, mammography screening, menopause, adequate intake of calcium and vitamin D, diet and exercise    F/U  Return in about 1 year (around 09/17/2020).  Kenzo Ozment B. Gerldine Suleiman, PA-C 09/18/2019 10:54 AM

## 2019-09-18 ENCOUNTER — Ambulatory Visit (INDEPENDENT_AMBULATORY_CARE_PROVIDER_SITE_OTHER): Payer: BC Managed Care – PPO | Admitting: Obstetrics and Gynecology

## 2019-09-18 ENCOUNTER — Other Ambulatory Visit: Payer: Self-pay

## 2019-09-18 ENCOUNTER — Encounter: Payer: Self-pay | Admitting: Certified Nurse Midwife

## 2019-09-18 ENCOUNTER — Encounter: Payer: Self-pay | Admitting: Obstetrics and Gynecology

## 2019-09-18 VITALS — BP 130/90 | Ht 65.0 in | Wt 167.0 lb

## 2019-09-18 DIAGNOSIS — Z1231 Encounter for screening mammogram for malignant neoplasm of breast: Secondary | ICD-10-CM

## 2019-09-18 DIAGNOSIS — N951 Menopausal and female climacteric states: Secondary | ICD-10-CM

## 2019-09-18 DIAGNOSIS — Z01419 Encounter for gynecological examination (general) (routine) without abnormal findings: Secondary | ICD-10-CM | POA: Diagnosis not present

## 2019-09-18 DIAGNOSIS — Z1211 Encounter for screening for malignant neoplasm of colon: Secondary | ICD-10-CM

## 2019-09-18 NOTE — Patient Instructions (Addendum)
I value your feedback and entrusting us with your care. If you get a [facility] patient survey, I would appreciate you taking the time to let us know about your experience today. Thank you! ° °As of January 05, 2019, your lab results will be released to your MyChart immediately, before I even have a chance to see them. Please give me time to review them and contact you if there are any abnormalities. Thank you for your patience.  ° °[facility]: [phone] ° ° ° °

## 2019-10-12 ENCOUNTER — Other Ambulatory Visit
Admission: RE | Admit: 2019-10-12 | Discharge: 2019-10-12 | Disposition: A | Discharge status: Home / Self Care | Payer: BC Managed Care – PPO | Source: Ambulatory Visit | Attending: Gastroenterology | Admitting: Gastroenterology

## 2019-10-12 ENCOUNTER — Other Ambulatory Visit: Payer: Self-pay

## 2019-10-12 DIAGNOSIS — Z20822 Contact with and (suspected) exposure to covid-19: Secondary | ICD-10-CM | POA: Insufficient documentation

## 2019-10-12 DIAGNOSIS — Z01812 Encounter for preprocedural laboratory examination: Secondary | ICD-10-CM | POA: Insufficient documentation

## 2019-10-12 LAB — SARS CORONAVIRUS 2 (TAT 6-24 HRS): SARS Coronavirus 2: NEGATIVE

## 2019-10-16 ENCOUNTER — Ambulatory Visit
Admission: RE | Admit: 2019-10-16 | Discharge: 2019-10-16 | Disposition: A | Discharge status: Home / Self Care | Payer: BC Managed Care – PPO | Attending: Gastroenterology | Admitting: Gastroenterology

## 2019-10-16 ENCOUNTER — Encounter: Payer: Self-pay | Admitting: Anesthesiology

## 2019-10-16 ENCOUNTER — Ambulatory Visit: Payer: BC Managed Care – PPO | Admitting: Anesthesiology

## 2019-10-16 ENCOUNTER — Other Ambulatory Visit: Payer: Self-pay

## 2019-10-16 ENCOUNTER — Encounter
Admission: RE | Disposition: A | Discharge status: Home / Self Care | Payer: Self-pay | Source: Home / Self Care | Attending: Gastroenterology

## 2019-10-16 DIAGNOSIS — K640 First degree hemorrhoids: Secondary | ICD-10-CM | POA: Diagnosis not present

## 2019-10-16 DIAGNOSIS — Z1211 Encounter for screening for malignant neoplasm of colon: Secondary | ICD-10-CM | POA: Diagnosis not present

## 2019-10-16 DIAGNOSIS — K635 Polyp of colon: Secondary | ICD-10-CM | POA: Diagnosis not present

## 2019-10-16 DIAGNOSIS — F17200 Nicotine dependence, unspecified, uncomplicated: Secondary | ICD-10-CM | POA: Diagnosis not present

## 2019-10-16 DIAGNOSIS — Z800 Family history of malignant neoplasm of digestive organs: Secondary | ICD-10-CM | POA: Insufficient documentation

## 2019-10-16 DIAGNOSIS — Z79899 Other long term (current) drug therapy: Secondary | ICD-10-CM | POA: Diagnosis not present

## 2019-10-16 DIAGNOSIS — E785 Hyperlipidemia, unspecified: Secondary | ICD-10-CM | POA: Insufficient documentation

## 2019-10-16 DIAGNOSIS — D125 Benign neoplasm of sigmoid colon: Secondary | ICD-10-CM | POA: Diagnosis not present

## 2019-10-16 HISTORY — PX: COLONOSCOPY WITH PROPOFOL: SHX5780

## 2019-10-16 HISTORY — DX: Hyperlipidemia, unspecified: E78.5

## 2019-10-16 LAB — POCT PREGNANCY, URINE: Preg Test, Ur: NEGATIVE

## 2019-10-16 SURGERY — COLONOSCOPY WITH PROPOFOL
Anesthesia: General

## 2019-10-16 MED ORDER — SODIUM CHLORIDE 0.9 % IV SOLN: INTRAVENOUS | Status: DC

## 2019-10-16 MED ORDER — MIDAZOLAM HCL 2 MG/2ML IJ SOLN
INTRAMUSCULAR | Status: AC
  Filled 2019-10-16: qty 2

## 2019-10-16 MED ORDER — EPHEDRINE SULFATE 50 MG/ML IJ SOLN
INTRAMUSCULAR | Status: DC | PRN
  Administered 2019-10-16: 10 mg via INTRAVENOUS

## 2019-10-16 MED ORDER — PROPOFOL 500 MG/50ML IV EMUL
INTRAVENOUS | Status: DC | PRN
  Administered 2019-10-16: 125 ug/kg/min via INTRAVENOUS

## 2019-10-16 MED ORDER — PROPOFOL 500 MG/50ML IV EMUL
INTRAVENOUS | Status: AC
  Filled 2019-10-16: qty 50

## 2019-10-16 MED ORDER — MIDAZOLAM HCL 2 MG/2ML IJ SOLN
INTRAMUSCULAR | Status: DC | PRN
  Administered 2019-10-16: 2 mg via INTRAVENOUS

## 2019-10-16 NOTE — Interval H&P Note (Signed)
History and Physical Interval Note:  10/16/2019 1:38 PM  Lorraine Gray  has presented today for surgery, with the diagnosis of screening fam hx colon cancer.  The various methods of treatment have been discussed with the patient and family. After consideration of risks, benefits and other options for treatment, the patient has consented to  Procedure(s): COLONOSCOPY WITH PROPOFOL (N/A) as a surgical intervention.  The patient's history has been reviewed, patient examined, no change in status, stable for surgery.  I have reviewed the patient's chart and labs.  Questions were answered to the patient's satisfaction.     [name]  Ok to proceed with colonoscopy.

## 2019-10-16 NOTE — H&P (Signed)
Outpatient short stay form Pre-procedure 10/16/2019 1:36 PM Raylene Miyamoto MD, MPH  Primary Physician: Dr. Netty Starring  Reason for visit:  Screening  History of present illness:   49 y/o lady with no significant medical history but with a family history of colon cancer in her father who was just younger than 60. Here for screening colonoscopy. No abdominal surgeries. No blood thinners.    Current Facility-Administered Medications:  .  0.9 %  sodium chloride infusion, , Intravenous, Continuous, Saadiya Wilfong, Hilton Cork, MD  Medications Prior to Admission  Medication Sig Dispense Refill Last Dose  . lovastatin (MEVACOR) 20 MG tablet TAKE 1 TABLET BY MOUTH EVERY DAY WITH DINNER   Past Week at Unknown time  . vitamin E 180 MG (400 UNITS) capsule Take by mouth.   Past Week at Unknown time     Allergies  Allergen Reactions  . Codeine Nausea Only and Nausea And Vomiting  . Hydrocodone Nausea And Vomiting     Past Medical History:  Diagnosis Date  . Anxiety   . GERD (gastroesophageal reflux disease)   . Headache(784.0)   . Hyperlipidemia   . Tobacco dependence     Review of systems:  Otherwise negative.    Physical Exam  Gen: Alert, oriented. Appears stated age.  HEENT: [place]/AT. PERRLA. Lungs: No respiratory distress Abd: soft, benign, no masses. Ext: No edema.     Planned procedures: Proceed with colonoscopy. The patient understands the nature of the planned procedure, indications, risks, alternatives and potential complications including but not limited to bleeding, infection, perforation, damage to internal organs and possible oversedation/side effects from anesthesia. The patient agrees and gives consent to proceed.  Please refer to procedure notes for findings, recommendations and patient disposition/instructions.     Raylene Miyamoto MD, MPH Gastroenterology 10/16/2019  1:36 PM

## 2019-10-16 NOTE — Anesthesia Procedure Notes (Signed)
Performed by: Vaughan Sine Pre-anesthesia Checklist: Patient identified, Emergency Drugs available, Suction available and Patient being monitored Oxygen Delivery Method: Nasal cannula Preoxygenation: Pre-oxygenation with 100% oxygen Induction Type: IV induction Placement Confirmation: positive ETCO2 and CO2 detector

## 2019-10-16 NOTE — Op Note (Signed)
[facility] Gastroenterology Patient Name: Lorraine Gray Procedure Date: 10/16/2019 1:52 PM MRN: [id number] Account #: [id number] Date of Birth: 08-May-1970 Admit Type: Outpatient Age: 49 Room: [facility] ENDO ROOM 1 Gender: Female Note Status: Finalized Procedure:             Colonoscopy Indications:           Screening in patient at increased risk: Family history                         of 1st-degree relative with colorectal cancer before                         age 72 years Providers:             Andrey Farmer MD, MD Referring MD:          No Local Md, MD (Referring MD) Medicines:             Monitored Anesthesia Care Complications:         No immediate complications. Estimated blood loss:                         Minimal. Procedure:             Pre-Anesthesia Assessment:                        - Prior to the procedure, a History and Physical was                         performed, and patient medications and allergies were                         reviewed. The patient is competent. The risks and                         benefits of the procedure and the sedation options and                         risks were discussed with the patient. All questions                         were answered and informed consent was obtained.                         Patient identification and proposed procedure were                         verified by the physician, the nurse, the anesthetist                         and the technician in the endoscopy suite. Mental                         Status Examination: alert and oriented. Airway                         Examination: normal oropharyngeal airway and neck  mobility. Respiratory Examination: clear to                         auscultation. CV Examination: normal. Prophylactic                         Antibiotics: The patient does not require prophylactic                         antibiotics. Prior Anticoagulants: The  patient has                         taken no previous anticoagulant or antiplatelet                         agents. ASA Grade Assessment: II - A patient with mild                         systemic disease. After reviewing the risks and                         benefits, the patient was deemed in satisfactory                         condition to undergo the procedure. The anesthesia                         plan was to use monitored anesthesia care (MAC).                         Immediately prior to administration of medications,                         the patient was re-assessed for adequacy to receive                         sedatives. The heart rate, respiratory rate, oxygen                         saturations, blood pressure, adequacy of pulmonary                         ventilation, and response to care were monitored                         throughout the procedure. The physical status of the                         patient was re-assessed after the procedure.                        After obtaining informed consent, the colonoscope was                         passed under direct vision. Throughout the procedure,                         the patient's blood pressure, pulse, and oxygen  saturations were monitored continuously. The                         Colonoscope was introduced through the anus and                         advanced to the the cecum, identified by appendiceal                         orifice and ileocecal valve. The colonoscopy was                         performed without difficulty. The patient tolerated                         the procedure well. The quality of the bowel                         preparation was excellent. Findings:      The perianal and digital rectal examinations were normal.      Two sessile polyps were found in the ascending colon. The polyps were       less than 1 mm in size. These polyps were removed with a jumbo cold        forceps. Resection and retrieval were complete. Estimated blood loss was       minimal.      Two sessile polyps were found in the sigmoid colon. The polyps were 1 to       2 mm in size. These polyps were removed with a jumbo cold forceps.       Resection and retrieval were complete. Estimated blood loss was minimal.      Non-bleeding internal hemorrhoids were found during retroflexion. The       hemorrhoids were Grade I (internal hemorrhoids that do not prolapse).      The exam was otherwise without abnormality. Impression:            - Two less than 1 mm polyps in the ascending colon,                         removed with a jumbo cold forceps. Resected and                         retrieved.                        - Two 1 to 2 mm polyps in the sigmoid colon, removed                         with a jumbo cold forceps. Resected and retrieved.                        - Non-bleeding internal hemorrhoids.                        - The examination was otherwise normal. Recommendation:        - Discharge patient to home.                        - Resume previous diet.                        -  Continue present medications.                        - Await pathology results.                        - Repeat colonoscopy for surveillance based on                         pathology results. No longer than 5 years from now.                        - Return to referring physician as previously                         scheduled. Procedure Code(s):     --- Professional ---                        [phone], Colonoscopy, flexible; with biopsy, single or                         multiple Diagnosis Code(s):     --- Professional ---                        Z80.0, Family history of malignant neoplasm of                         digestive organs                        K63.5, Polyp of colon                        K64.0, First degree hemorrhoids CPT copyright 2019 American Medical Association. All rights reserved. The codes  documented in this report are preliminary and upon coder review may  be revised to meet current compliance requirements. Andrey Farmer, MD Andrey Farmer MD, MD 10/16/2019 2:19:59 PM Number of Addenda: 0 Note Initiated On: 10/16/2019 1:52 PM Scope Withdrawal Time: 0 hours 10 minutes 46 seconds  Total Procedure Duration: 0 hours 17 minutes 55 seconds  Estimated Blood Loss:  Estimated blood loss was minimal.      [facility]

## 2019-10-16 NOTE — Anesthesia Preprocedure Evaluation (Signed)
Anesthesia Evaluation  Patient identified by MRN, date of birth, ID band Patient awake    Reviewed: Allergy & Precautions, NPO status , Patient's Chart, lab work & pertinent test results  History of Anesthesia Complications Negative for: history of anesthetic complications  Airway Mallampati: II  TM Distance: >3 FB Neck ROM: Full   Comment: Limited neck FLEXION Dental no notable dental hx. (+) Teeth Intact   Pulmonary neg sleep apnea, neg COPD, Current Smoker and Patient abstained from smoking.,    Pulmonary exam normal breath sounds clear to auscultation       Cardiovascular Exercise Tolerance: Good METS(-) hypertension(-) CAD and (-) Past MI negative cardio ROS  (-) dysrhythmias  Rhythm:Regular Rate:Normal - Systolic murmurs    Neuro/Psych  Headaches, PSYCHIATRIC DISORDERS Anxiety    GI/Hepatic GERD  Controlled,(+)     (-) substance abuse  ,   Endo/Other  neg diabetes  Renal/GU negative Renal ROS     Musculoskeletal   Abdominal   Peds  Hematology   Anesthesia Other Findings Past Medical History: No date: Anxiety No date: GERD (gastroesophageal reflux disease) No date: Headache(784.0) No date: Hyperlipidemia No date: Tobacco dependence  Reproductive/Obstetrics                             Anesthesia Physical Anesthesia Plan  ASA: II  Anesthesia Plan: General   Post-op Pain Management:    Induction: Intravenous  PONV Risk Score and Plan: 2 and Ondansetron, Propofol infusion and TIVA  Airway Management Planned: Nasal Cannula  Additional Equipment: None  Intra-op Plan:   Post-operative Plan:   Informed Consent: I have reviewed the patients History and Physical, chart, labs and discussed the procedure including the risks, benefits and alternatives for the proposed anesthesia with the patient or authorized representative who has indicated his/her understanding and  acceptance.     Dental advisory given  Plan Discussed with: CRNA and Surgeon  Anesthesia Plan Comments: (Discussed risks of anesthesia with patient, including possibility of difficulty with spontaneous ventilation under anesthesia necessitating airway intervention, PONV, and rare risks such as cardiac or respiratory or neurological events. Patient understands.)        Anesthesia Quick Evaluation

## 2019-10-16 NOTE — Transfer of Care (Signed)
Immediate Anesthesia Transfer of Care Note  Patient: JASENIA WEILBACHER  Procedure(s) Performed: COLONOSCOPY WITH PROPOFOL (N/A )  Patient Location: PACU  Anesthesia Type:General  Level of Consciousness: awake and sedated  Airway & Oxygen Therapy: Patient Spontanous Breathing and Patient connected to nasal cannula oxygen  Post-op Assessment: Report given to RN and Post -op Vital signs reviewed and stable  Post vital signs: Reviewed  Last Vitals:  Vitals Value Taken Time  BP    Temp    Pulse    Resp    SpO2      Last Pain:  Vitals:   10/16/19 1337  TempSrc: Temporal         Complications: No complications documented.

## 2019-10-17 NOTE — Anesthesia Postprocedure Evaluation (Signed)
Anesthesia Post Note  Patient: Lorraine Gray  Procedure(s) Performed: COLONOSCOPY WITH PROPOFOL (N/A )  Patient location during evaluation: Endoscopy Anesthesia Type: General Level of consciousness: awake and alert Pain management: pain level controlled Vital Signs Assessment: post-procedure vital signs reviewed and stable Respiratory status: spontaneous breathing, nonlabored ventilation, respiratory function stable and patient connected to nasal cannula oxygen Cardiovascular status: blood pressure returned to baseline and stable Postop Assessment: no apparent nausea or vomiting Anesthetic complications: no   No complications documented.   Last Vitals:  Vitals:   10/16/19 1419 10/16/19 1429  BP: 132/81 (!) 115/56  Pulse:    Resp:    Temp: (!) 36 C   SpO2:      Last Pain:  Vitals:   10/17/19 0716  TempSrc:   PainSc: 0-No pain                 Arita Miss

## 2019-10-18 LAB — SURGICAL PATHOLOGY

## 2020-03-29 DIAGNOSIS — R7309 Other abnormal glucose: Secondary | ICD-10-CM | POA: Diagnosis not present

## 2020-03-29 DIAGNOSIS — F17200 Nicotine dependence, unspecified, uncomplicated: Secondary | ICD-10-CM | POA: Diagnosis not present

## 2020-03-29 DIAGNOSIS — E7800 Pure hypercholesterolemia, unspecified: Secondary | ICD-10-CM | POA: Diagnosis not present

## 2020-04-05 DIAGNOSIS — R7303 Prediabetes: Secondary | ICD-10-CM | POA: Diagnosis not present

## 2020-04-05 DIAGNOSIS — F17200 Nicotine dependence, unspecified, uncomplicated: Secondary | ICD-10-CM | POA: Diagnosis not present

## 2020-04-05 DIAGNOSIS — Z0000 Encounter for general adult medical examination without abnormal findings: Secondary | ICD-10-CM | POA: Diagnosis not present

## 2020-04-05 DIAGNOSIS — E7800 Pure hypercholesterolemia, unspecified: Secondary | ICD-10-CM | POA: Diagnosis not present

## 2020-07-20 DIAGNOSIS — S43402A Unspecified sprain of left shoulder joint, initial encounter: Secondary | ICD-10-CM | POA: Diagnosis not present

## 2020-07-20 DIAGNOSIS — S4992XA Unspecified injury of left shoulder and upper arm, initial encounter: Secondary | ICD-10-CM | POA: Diagnosis not present

## 2020-07-20 DIAGNOSIS — M25512 Pain in left shoulder: Secondary | ICD-10-CM | POA: Diagnosis not present

## 2020-08-06 DIAGNOSIS — M79602 Pain in left arm: Secondary | ICD-10-CM | POA: Diagnosis not present

## 2020-08-21 DIAGNOSIS — M25512 Pain in left shoulder: Secondary | ICD-10-CM | POA: Diagnosis not present

## 2020-08-22 ENCOUNTER — Other Ambulatory Visit (HOSPITAL_COMMUNITY): Payer: Self-pay | Admitting: Orthopedic Surgery

## 2020-08-22 ENCOUNTER — Other Ambulatory Visit: Payer: Self-pay | Admitting: Orthopedic Surgery

## 2020-08-22 DIAGNOSIS — M25512 Pain in left shoulder: Secondary | ICD-10-CM

## 2020-09-02 ENCOUNTER — Other Ambulatory Visit: Payer: Self-pay

## 2020-09-02 ENCOUNTER — Ambulatory Visit
Admission: RE | Admit: 2020-09-02 | Discharge: 2020-09-02 | Disposition: A | Discharge status: Home / Self Care | Payer: BC Managed Care – PPO | Source: Ambulatory Visit | Attending: Orthopedic Surgery | Admitting: Orthopedic Surgery

## 2020-09-02 DIAGNOSIS — M25512 Pain in left shoulder: Secondary | ICD-10-CM | POA: Diagnosis not present

## 2020-09-02 DIAGNOSIS — S42255A Nondisplaced fracture of greater tuberosity of left humerus, initial encounter for closed fracture: Secondary | ICD-10-CM | POA: Diagnosis not present

## 2020-09-04 DIAGNOSIS — S42255A Nondisplaced fracture of greater tuberosity of left humerus, initial encounter for closed fracture: Secondary | ICD-10-CM | POA: Diagnosis not present

## 2020-10-01 DIAGNOSIS — E7800 Pure hypercholesterolemia, unspecified: Secondary | ICD-10-CM | POA: Diagnosis not present

## 2020-10-01 DIAGNOSIS — R7303 Prediabetes: Secondary | ICD-10-CM | POA: Diagnosis not present

## 2020-10-08 DIAGNOSIS — E7800 Pure hypercholesterolemia, unspecified: Secondary | ICD-10-CM | POA: Diagnosis not present

## 2020-10-08 DIAGNOSIS — R7303 Prediabetes: Secondary | ICD-10-CM | POA: Diagnosis not present

## 2020-10-08 DIAGNOSIS — F411 Generalized anxiety disorder: Secondary | ICD-10-CM | POA: Diagnosis not present

## 2020-10-08 DIAGNOSIS — R0789 Other chest pain: Secondary | ICD-10-CM | POA: Diagnosis not present

## 2020-10-08 DIAGNOSIS — F17210 Nicotine dependence, cigarettes, uncomplicated: Secondary | ICD-10-CM | POA: Diagnosis not present

## 2020-10-23 DIAGNOSIS — S42255A Nondisplaced fracture of greater tuberosity of left humerus, initial encounter for closed fracture: Secondary | ICD-10-CM | POA: Diagnosis not present

## 2020-12-10 DIAGNOSIS — J069 Acute upper respiratory infection, unspecified: Secondary | ICD-10-CM | POA: Diagnosis not present

## 2021-04-04 DIAGNOSIS — E7800 Pure hypercholesterolemia, unspecified: Secondary | ICD-10-CM | POA: Diagnosis not present

## 2021-04-04 DIAGNOSIS — F17200 Nicotine dependence, unspecified, uncomplicated: Secondary | ICD-10-CM | POA: Diagnosis not present

## 2021-04-04 DIAGNOSIS — R7303 Prediabetes: Secondary | ICD-10-CM | POA: Diagnosis not present

## 2021-04-08 DIAGNOSIS — E7800 Pure hypercholesterolemia, unspecified: Secondary | ICD-10-CM | POA: Diagnosis not present

## 2021-04-08 DIAGNOSIS — F17200 Nicotine dependence, unspecified, uncomplicated: Secondary | ICD-10-CM | POA: Diagnosis not present

## 2021-04-08 DIAGNOSIS — Z0000 Encounter for general adult medical examination without abnormal findings: Secondary | ICD-10-CM | POA: Diagnosis not present

## 2021-05-12 ENCOUNTER — Other Ambulatory Visit (HOSPITAL_COMMUNITY)
Admission: RE | Admit: 2021-05-12 | Discharge: 2021-05-12 | Disposition: A | Discharge status: Home / Self Care | Payer: BC Managed Care – PPO | Source: Ambulatory Visit | Attending: Obstetrics and Gynecology | Admitting: Obstetrics and Gynecology

## 2021-05-12 ENCOUNTER — Ambulatory Visit (INDEPENDENT_AMBULATORY_CARE_PROVIDER_SITE_OTHER): Payer: BC Managed Care – PPO | Admitting: Obstetrics and Gynecology

## 2021-05-12 ENCOUNTER — Encounter: Payer: Self-pay | Admitting: Obstetrics and Gynecology

## 2021-05-12 VITALS — BP 100/70 | Ht 65.0 in | Wt 163.0 lb

## 2021-05-12 DIAGNOSIS — Z1151 Encounter for screening for human papillomavirus (HPV): Secondary | ICD-10-CM

## 2021-05-12 DIAGNOSIS — Z124 Encounter for screening for malignant neoplasm of cervix: Secondary | ICD-10-CM | POA: Insufficient documentation

## 2021-05-12 DIAGNOSIS — Z1231 Encounter for screening mammogram for malignant neoplasm of breast: Secondary | ICD-10-CM | POA: Diagnosis not present

## 2021-05-12 DIAGNOSIS — Z01419 Encounter for gynecological examination (general) (routine) without abnormal findings: Secondary | ICD-10-CM | POA: Diagnosis not present

## 2021-05-12 DIAGNOSIS — N951 Menopausal and female climacteric states: Secondary | ICD-10-CM

## 2021-05-12 NOTE — Patient Instructions (Signed)
I value your feedback and you entrusting us with your care. If you get a [facility] patient survey, I would appreciate you taking the time to let us know about your experience today. Thank you!   [facility]: [phone]  

## 2021-05-12 NOTE — Progress Notes (Signed)
? ?PCP: Dion Body, MD ? ? ?Chief Complaint  ?Patient presents with  ? Gynecologic Exam  ?  No concerns  ? ? ?HPI: ?     Ms. Lorraine Gray is a 51 y.o. G1P1001 whose LMP was No LMP recorded. (Menstrual status: Other)., presents today for her annual examination.  Her menses are absent due to menopause. She does not have PMB. She does have vasomotor sx, no improvement with Vit D. Never tried estroven. ? ?Sex activity: single partner, contraception - post menopausal status. She does not have vaginal dryness/pain. ? ?Last Pap: 02/07/16  Results were: no abnormalities /neg HPV DNA.  ? ?Last mammogram: 09/18/19 at [facility]; Results were: normal--routine follow-up in 12 months.  ?There is no FH of breast cancer. There is no FH of ovarian cancer. The patient does not do self-breast exams. Hx of fibrocystic breasts ? ?Colonoscopy: 9/21 with polyps at [facility]; repeat due after 5 yrs; FH colon cancer in her father and MGM ? ?Tobacco use: 1 ppd, not planning to quit ?Alcohol use: none  ?No drug use ?Exercise: not active ? ?She does get adequate calcium but not Vitamin D in her diet. ? ?Labs with PCP. ? ? ?Past Medical History:  ?Diagnosis Date  ? Anxiety   ? GERD (gastroesophageal reflux disease)   ? Headache(784.0)   ? Hyperlipidemia   ? Tobacco dependence   ? ? ?Past Surgical History:  ?Procedure Laterality Date  ? ANTERIOR CERVICAL DECOMP/DISCECTOMY FUSION  03/11/2011  ? Procedure: ANTERIOR CERVICAL DECOMPRESSION/DISCECTOMY FUSION 3 LEVELS;  Surgeon: Ophelia Charter, MD;  Location: [facility];  Service: Neurosurgery;  Laterality: N/A;  Cervical four-five, five-six, six-seven anterior cervical decompression fusion with interbody prosthesis and plating  ? COLONOSCOPY WITH PROPOFOL N/A 10/16/2019  ? Procedure: COLONOSCOPY WITH PROPOFOL;  Surgeon: [name], MD;  Location: [facility] ENDOSCOPY;  Service: Endoscopy;  Laterality: N/A;  ? Excision of a radicular cyst  01/2017  ? maxilla  ? WISDOM TOOTH EXTRACTION     ? ? ?Family History  ?Problem Relation Age of Onset  ? Colon cancer Father 33  ? Congestive Heart Failure Maternal Grandmother   ? Heart attack Maternal Grandmother   ? Colon cancer Maternal Grandmother   ? Diabetes Maternal Grandfather   ? Stroke Maternal Grandfather   ? ? ?Social History  ? ?Socioeconomic History  ? Marital status: Single  ?  Spouse name: Not on file  ? Number of children: 1  ? Years of education: Not on file  ? Highest education level: Not on file  ?Occupational History  ? Not on file  ?Tobacco Use  ? Smoking status: Every Day  ?  Packs/day: 1.00  ?  Types: Cigarettes  ? Smokeless tobacco: Never  ?Vaping Use  ? Vaping Use: Never used  ?Substance and Sexual Activity  ? Alcohol use: No  ? Drug use: No  ? Sexual activity: Yes  ?  Birth control/protection: None  ?Other Topics Concern  ? Not on file  ?Social History Narrative  ? Not on file  ? ?Social Determinants of Health  ? ?Financial Resource Strain: Not on file  ?Food Insecurity: Not on file  ?Transportation Needs: Not on file  ?Physical Activity: Not on file  ?Stress: Not on file  ?Social Connections: Not on file  ?Intimate Partner Violence: Not on file  ? ? ? ?Current Outpatient Medications:  ?  albuterol (VENTOLIN HFA) 108 (90 Base) MCG/ACT inhaler, SMARTSIG:1-2 Puff(s) Via Inhaler Every 4-6 Hours  PRN, Disp: , Rfl:  ?  lovastatin (MEVACOR) 20 MG tablet, TAKE 1 TABLET BY MOUTH EVERY DAY WITH DINNER, Disp: , Rfl:  ? ? ? ? ?ROS: ? ?Review of Systems  ?Constitutional:  Negative for fatigue, fever and unexpected weight change.  ?Respiratory:  Negative for cough, shortness of breath and wheezing.   ?Cardiovascular:  Negative for chest pain, palpitations and leg swelling.  ?Gastrointestinal:  Negative for blood in stool, constipation, diarrhea, nausea and vomiting.  ?Endocrine: Negative for cold intolerance, heat intolerance and polyuria.  ?Genitourinary:  Negative for dyspareunia, dysuria, flank pain, frequency, genital sores, hematuria, menstrual  problem, pelvic pain, urgency, vaginal bleeding, vaginal discharge and vaginal pain.  ?Musculoskeletal:  Negative for back pain, joint swelling and myalgias.  ?Skin:  Negative for rash.  ?Neurological:  Negative for dizziness, syncope, light-headedness, numbness and headaches.  ?Hematological:  Negative for adenopathy.  ?Psychiatric/Behavioral:  Negative for agitation, confusion, sleep disturbance and suicidal ideas. The patient is not nervous/anxious.  BREAST: No symptoms ? ? ? ?Objective: ?BP 100/70   Ht '5\' 5"'$  (1.651 m)   Wt 163 lb (73.9 kg)   BMI 27.12 kg/m?  ? ? ?Physical Exam ?Constitutional:   ?   Appearance: She is well-developed.  ?Genitourinary:  ?   Vulva normal.  ?   Right Labia: No rash, tenderness or lesions. ?   Left Labia: No tenderness, lesions or rash. ?   No vaginal discharge, erythema or tenderness.  ? ?   Right Adnexa: not tender and no mass present. ?   Left Adnexa: not tender and no mass present. ?   No cervical friability or polyp.  ?   Uterus is not enlarged or tender.  ?Breasts: ?   Right: No mass, nipple discharge, skin change or tenderness.  ?   Left: No mass, nipple discharge, skin change or tenderness.  ?Neck:  ?   Thyroid: No thyromegaly.  ?Cardiovascular:  ?   Rate and Rhythm: Normal rate and regular rhythm.  ?   Heart sounds: Normal heart sounds. No murmur heard. ?Pulmonary:  ?   Effort: Pulmonary effort is normal.  ?   Breath sounds: Normal breath sounds.  ?Abdominal:  ?   Palpations: Abdomen is soft.  ?   Tenderness: There is no abdominal tenderness. There is no guarding or rebound.  ?Musculoskeletal:     ?   General: Normal range of motion.  ?   Cervical back: Normal range of motion.  ?Lymphadenopathy:  ?   Cervical: No cervical adenopathy.  ?Neurological:  ?   General: No focal deficit present.  ?   Mental Status: She is alert and oriented to person, place, and time.  ?   Cranial Nerves: No cranial nerve deficit.  ?Skin: ?   General: Skin is warm and dry.  ?Psychiatric:     ?    Mood and Affect: Mood normal.     ?   Behavior: Behavior normal.     ?   Thought Content: Thought content normal.     ?   Judgment: Judgment normal.  ?Vitals reviewed.  ? ? ?Assessment/Plan: ?Encounter for annual routine gynecological examination ? ?Cervical cancer screening - Plan: Cytology - PAP ? ?Screening for HPV (human papillomavirus) - Plan: Cytology - PAP ? ?Encounter for screening mammogram for malignant neoplasm of breast - Plan: MM 3D SCREEN BREAST BILATERAL; pt to schedule mammo ? ?Vasomotor symptoms due to menopause--try estroven. F/u prn.  ? ?        ?  GYN counsel breast self exam, mammography screening, menopause, adequate intake of calcium and vitamin D, diet and exercise ? ?  F/U ? Return in about 1 year (around 05/13/2022). ? ?Dshawn Mcnay B. Cathye Kreiter, PA-C ?05/12/2021 ?11:05 AM ?

## 2021-05-13 LAB — CYTOLOGY - PAP
Comment: NEGATIVE
Diagnosis: NEGATIVE
High risk HPV: NEGATIVE

## 2021-07-21 DIAGNOSIS — Z1231 Encounter for screening mammogram for malignant neoplasm of breast: Secondary | ICD-10-CM | POA: Diagnosis not present

## 2021-07-22 ENCOUNTER — Encounter: Payer: Self-pay | Admitting: Obstetrics and Gynecology

## 2021-09-24 IMAGING — MR MR SHOULDER*L* W/O CM
4 of 5 series · 31 of 40 positions shown · non-contrast
Comparison: Prior images not retrievable at the time of this
dictation.

CLINICAL DATA: Fall, pain

EXAM:
MRI OF THE LEFT SHOULDER WITHOUT CONTRAST
TECHNIQUE: Multiplanar, multisequence MR imaging of the shoulder was performed.
No intravenous contrast was administered.

[Series 5: T2 fat-sat · axial · left · 4.0mm · 0.44mm/px · z∈[-26,+94]mm · 8 of 26 slices shown (1 of 3)]
[im 1/26]
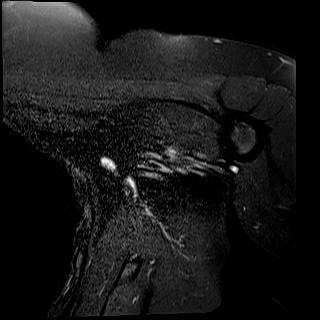
[im 4/26]
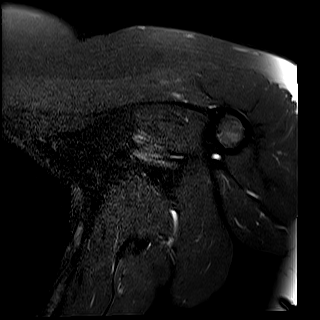
[im 8/26]
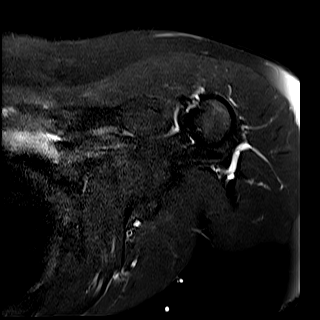
[im 11/26]
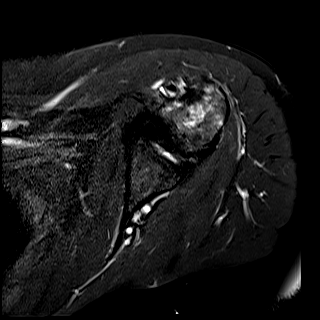
[im 15/26]
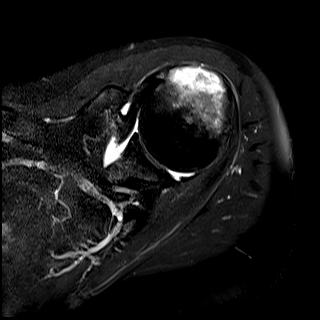
[im 18/26]
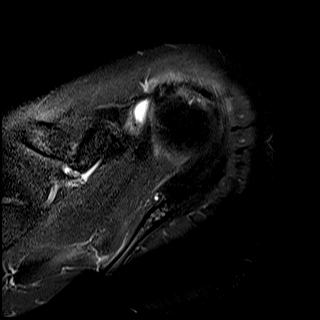
[im 22/26]
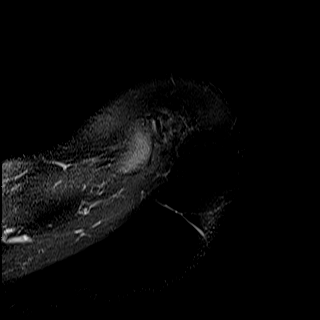
[im 26/26]
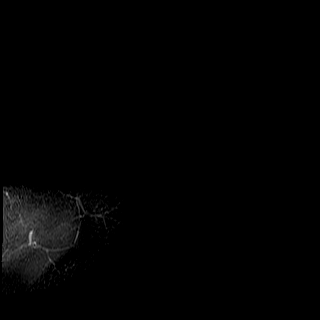

[Series 6: PD · oblique · left · 4.0mm · 0.44mm/px · 9 of 28 slices shown]
[im 1/28]
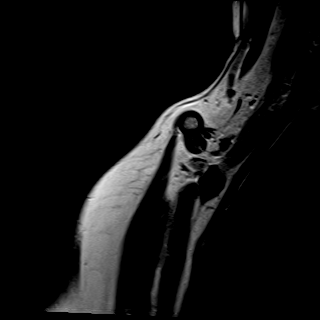
[im 4/28]
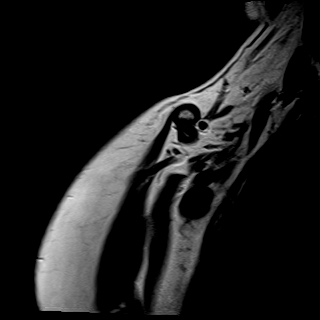
[im 7/28]
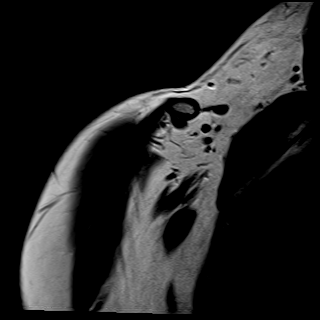
[im 11/28]
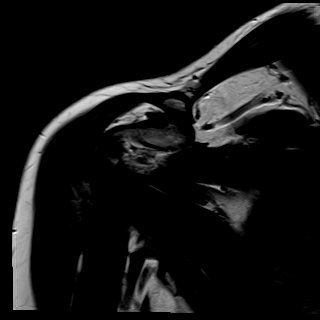
[im 14/28]
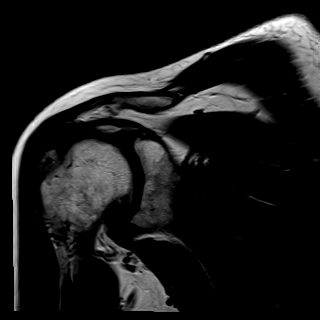
[im 17/28]
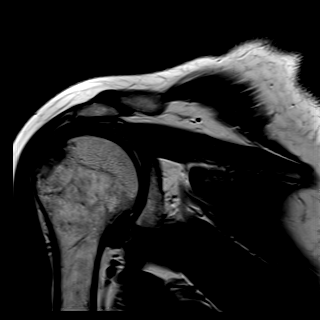
[im 21/28]
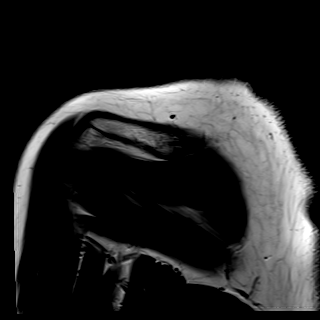
[im 24/28]
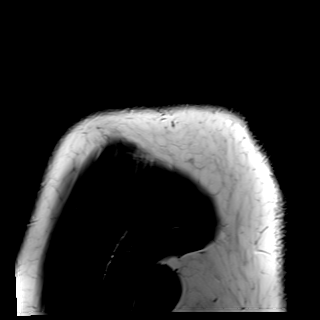
[im 28/28]
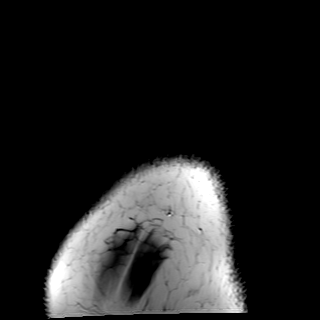

[Series 7: T2 fat-sat · oblique · left · 4.0mm · 0.44mm/px · 9 of 28 slices shown (2 of 3)]
[im 1/28]
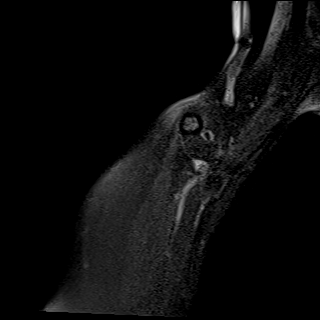
[im 4/28]
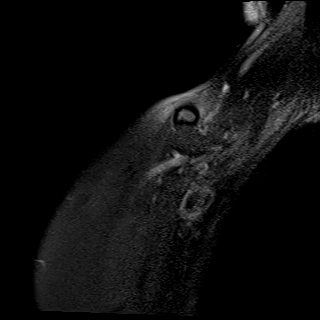
[im 7/28]
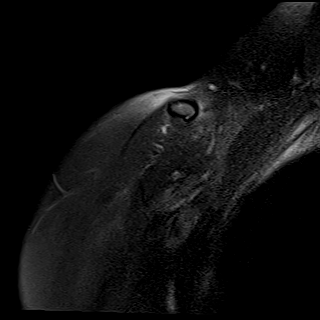
[im 11/28]
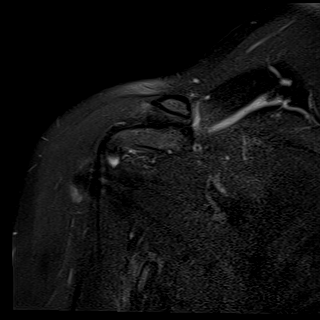
[im 14/28]
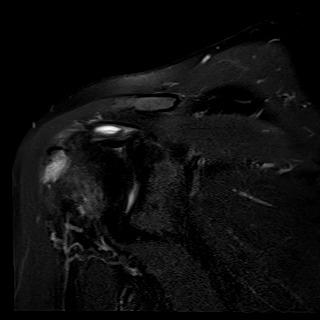
[im 17/28]
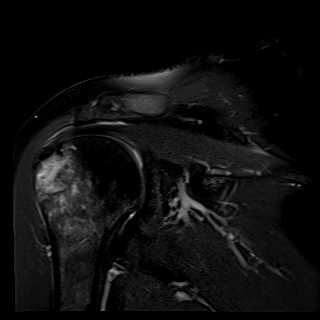
[im 21/28]
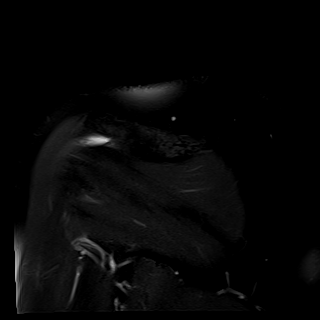
[im 24/28]
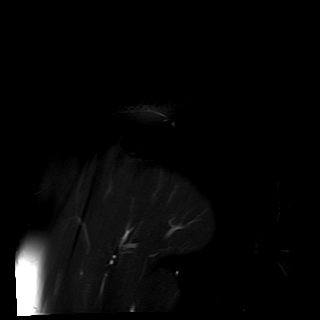
[im 28/28]
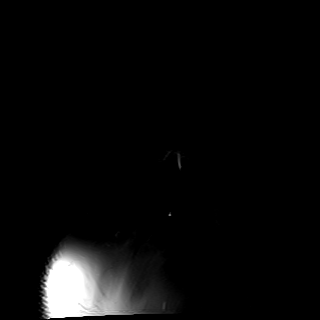

[Series 8: T2 fat-sat · coronal · left · 4.0mm · 0.22mm/px · 5 of 24 slices shown (3 of 3)]
[im 1/24]
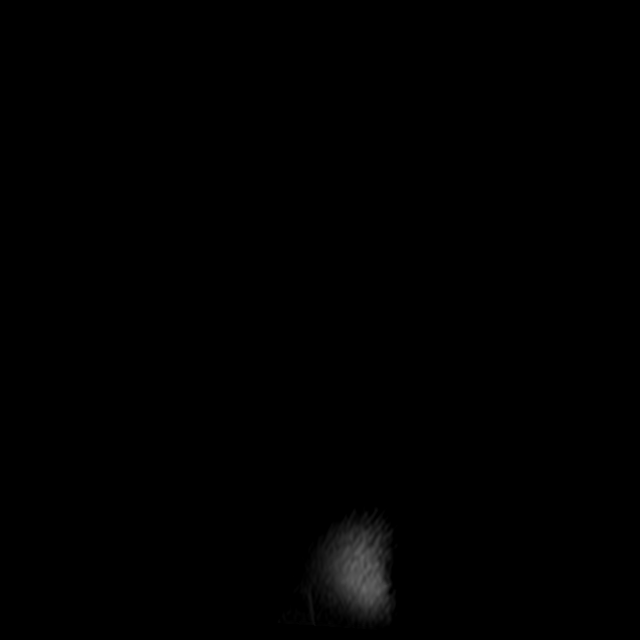
[im 4/24]
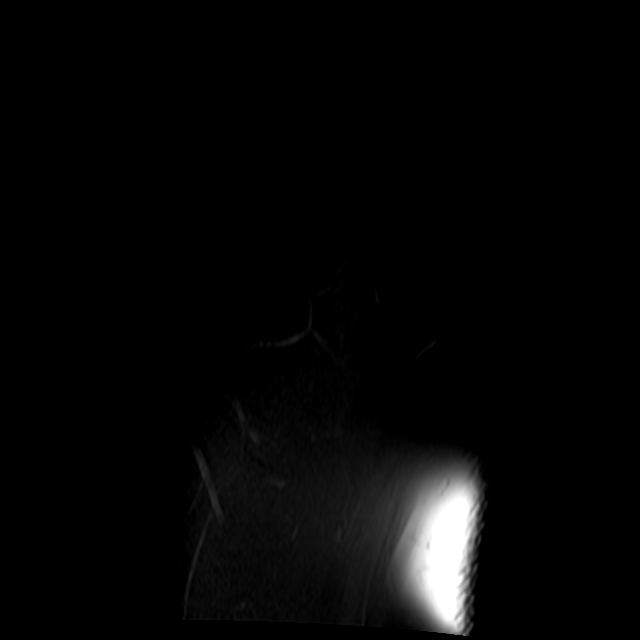
[im 8/24]
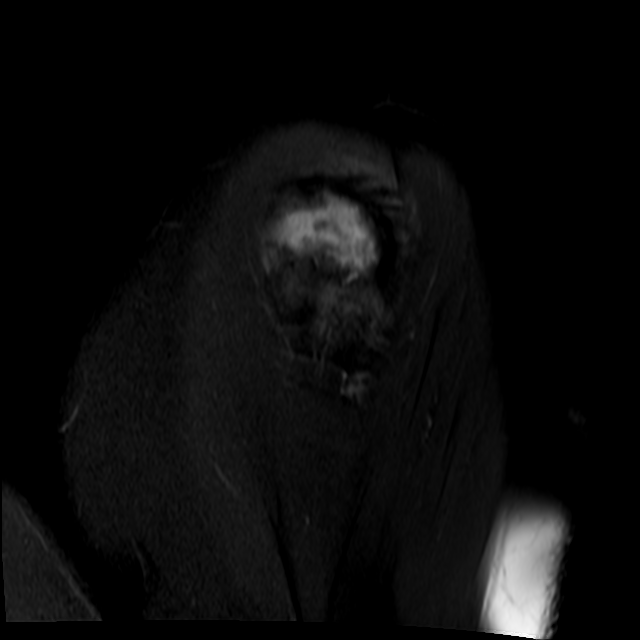
[im 12/24]
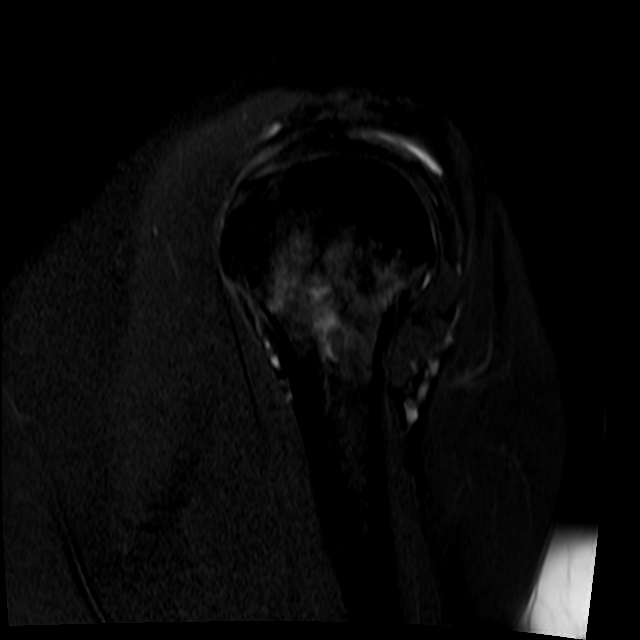
[im 20/24]
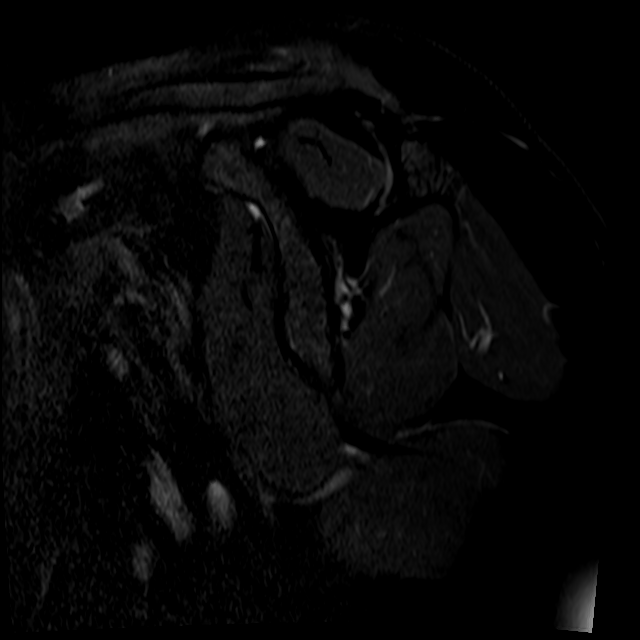

[31 of 40 positions shown; findings below may reference images not displayed]

FINDINGS: Rotator cuff: Distal supraspinatus infraspinatus tendinosis with
bursal sided fraying. Teres minor tendon is intact. Subscapularis
tendon is intact.

Muscles: No muscle atrophy or edema. No intramuscular fluid
collection or hematoma.

Biceps Long Head: Intraarticular and extraarticular portions of the
biceps tendon are intact.

Acromioclavicular Joint: Mild arthropathy of the acromioclavicular
joint. Minimal subacromial/subdeltoid bursal fluid.

Glenohumeral Joint: No joint effusion. Mild chondrosis.

Labrum: Grossly intact, but evaluation is limited by lack of
intraarticular fluid/contrast.

Bones: There is focal bony edema and multidirectional nondisplaced
fracture lines along the greater tuberosity. There is mild edema
within and surrounding a right upper rib (coronal T2 fat-sat image
7-8.

Other: No fluid collection or hematoma.
IMPRESSION: Nondisplaced greater tuberosity fracture.

Mild edema surrounding and within a right upper rib, possibly due to
a nondisplaced rib fracture.

Distal supraspinatus and infraspinatus tendinosis with bursal sided
fraying and mild subacromial-subdeltoid bursitis. No high-grade or
retracted cuff tear. No muscle atrophy.

## 2022-10-12 LAB — HM MAMMOGRAPHY

## 2022-10-13 ENCOUNTER — Encounter: Payer: Self-pay | Admitting: Obstetrics and Gynecology
